# Patient Record
Sex: Male | Born: 1990 | Hispanic: No | Marital: Single | Smoking: Never smoker
Health system: Southern US, Community
[De-identification: ages and names within clinical notes are randomized; demographics above are authoritative.]

## PROBLEM LIST (undated history)

## (undated) DIAGNOSIS — D751 Secondary polycythemia: Secondary | ICD-10-CM

## (undated) DIAGNOSIS — F419 Anxiety disorder, unspecified: Secondary | ICD-10-CM

## (undated) DIAGNOSIS — J302 Other seasonal allergic rhinitis: Secondary | ICD-10-CM

## (undated) HISTORY — DX: Secondary polycythemia: D75.1

---
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
## 2008-09-03 ENCOUNTER — Emergency Department (HOSPITAL_COMMUNITY): Admission: EM | Admit: 2008-09-03 | Discharge: 2008-09-03 | Payer: Self-pay | Admitting: Emergency Medicine

## 2009-09-20 ENCOUNTER — Emergency Department (HOSPITAL_COMMUNITY): Admission: EM | Admit: 2009-09-20 | Discharge: 2009-09-20 | Payer: Self-pay | Admitting: Emergency Medicine

## 2011-10-17 ENCOUNTER — Encounter: Payer: Self-pay | Admitting: Emergency Medicine

## 2011-10-17 ENCOUNTER — Emergency Department (HOSPITAL_COMMUNITY)
Admission: EM | Admit: 2011-10-17 | Discharge: 2011-10-17 | Disposition: A | Discharge status: Home / Self Care | Payer: Self-pay | Attending: Emergency Medicine | Admitting: Emergency Medicine

## 2011-10-17 DIAGNOSIS — T7840XA Allergy, unspecified, initial encounter: Secondary | ICD-10-CM

## 2011-10-17 DIAGNOSIS — L298 Other pruritus: Secondary | ICD-10-CM | POA: Insufficient documentation

## 2011-10-17 DIAGNOSIS — T781XXA Other adverse food reactions, not elsewhere classified, initial encounter: Secondary | ICD-10-CM | POA: Insufficient documentation

## 2011-10-17 DIAGNOSIS — R221 Localized swelling, mass and lump, neck: Secondary | ICD-10-CM | POA: Insufficient documentation

## 2011-10-17 DIAGNOSIS — L2989 Other pruritus: Secondary | ICD-10-CM | POA: Insufficient documentation

## 2011-10-17 DIAGNOSIS — R220 Localized swelling, mass and lump, head: Secondary | ICD-10-CM | POA: Insufficient documentation

## 2011-10-17 DIAGNOSIS — J302 Other seasonal allergic rhinitis: Secondary | ICD-10-CM | POA: Insufficient documentation

## 2011-10-17 HISTORY — DX: Other seasonal allergic rhinitis: J30.2

## 2011-10-17 MED ORDER — FAMOTIDINE 20 MG PO TABS
20.0000 mg | ORAL_TABLET | Freq: Once | ORAL | Status: AC
  Administered 2011-10-17: 20 mg via ORAL
  Filled 2011-10-17: qty 1

## 2011-10-17 MED ORDER — DIPHENHYDRAMINE HCL 25 MG PO CAPS
50.0000 mg | ORAL_CAPSULE | Freq: Once | ORAL | Status: AC
  Administered 2011-10-17: 50 mg via ORAL
  Filled 2011-10-17: qty 2

## 2011-10-17 MED ORDER — PREDNISONE 20 MG PO TABS
60.0000 mg | ORAL_TABLET | Freq: Once | ORAL | Status: AC
  Administered 2011-10-17: 60 mg via ORAL
  Filled 2011-10-17: qty 3

## 2011-10-17 MED ORDER — PREDNISONE 20 MG PO TABS: 40.0000 mg | ORAL_TABLET | Freq: Every day | ORAL | Status: AC

## 2011-10-17 NOTE — ED Notes (Signed)
Pt c/o allergic reaction with itching on lips x 3 days and swollen area in top of palate; pt sts started 3 days ago; pt denies SOB or trouble breathing

## 2011-10-17 NOTE — ED Notes (Signed)
No obvious swelling noted. Pt breathing easy unlabored. No distress noted.

## 2011-10-17 NOTE — ED Notes (Signed)
Pt states feels better. No resp distress noted.

## 2011-10-17 NOTE — ED Provider Notes (Signed)
History     CSN: [id number]  Arrival date & time 10/17/11  1555   First MD Initiated Contact with Patient 10/17/11 2019      Chief Complaint  Patient presents with  . Allergic Reaction    (Consider location/radiation/quality/duration/timing/severity/associated sxs/prior treatment) Patient is a 20 y.o. male presenting with allergic reaction. The history is provided by the patient.  Allergic Reaction The primary symptoms do not include wheezing, shortness of breath, cough, nausea or vomiting. The current episode started more than 2 days ago. The problem has not changed since onset. Significant symptoms also include itching.   patient reports that after eating an Asian restaurant on Sunday evening he began to notice itching and slight swelling to his lips, and a swelling sensation to his palate. The symptoms seemed to resolve and then return. Now he is most concerned with the swelling to a small area on his palate just behind his front teeth. Since the symptoms did not completely resolve he began to become concerned about infection. Denies that he has or has ever had shortness of breath wheezing or difficulty swallowing since onset of symptoms. He does state that the itching and slight swelling to his lips continues to be intermittent.  Past Medical History  Diagnosis Date  . Seasonal allergies     History reviewed. No pertinent past surgical history.  History reviewed. No pertinent family history.  History  Substance Use Topics  . Smoking status: Passive Smoker  . Smokeless tobacco: Not on file  . Alcohol Use: Yes      Review of Systems  Constitutional: Negative.   HENT: Negative.   Eyes: Negative.   Respiratory: Negative.  Negative for cough, shortness of breath and wheezing.   Cardiovascular: Negative.   Gastrointestinal: Negative.  Negative for nausea and vomiting.  Genitourinary: Negative.   Musculoskeletal: Negative.   Skin: Positive for itching.  Neurological:  Negative.   Hematological: Negative.   Psychiatric/Behavioral: Negative.     Allergies  Penicillins  Home Medications  No current outpatient prescriptions on file.  BP 125/86  Pulse 72  Temp(Src) 98.3 F (36.8 C) (Oral)  Resp 16  SpO2 100%  Physical Exam  Constitutional: He appears well-developed and well-nourished.  HENT:  Head: Normocephalic and atraumatic.  Mouth/Throat: Uvula is midline and mucous membranes are normal. No uvula swelling.         Area just behind pt's front teeth that he perceives as swollen and TTP. No objective swelling, erythema, or lesions noted.  Eyes: Conjunctivae are normal.  Neck: Neck supple.  Cardiovascular: Normal rate and regular rhythm.   Pulmonary/Chest: Effort normal and breath sounds normal.  Abdominal: Soft. Bowel sounds are normal.  Musculoskeletal: Normal range of motion.  Neurological: He is alert.  Skin: Skin is warm and dry.  Psychiatric: He has a normal mood and affect.    ED Course  Procedures findings and impression discussed with patient. Will treat his allergic reaction and encourage patient to get established with primary care physician. Referrals provided. Patient has also been instructed to return for worsening symptoms. Patient is agreeable with plan.  Labs Reviewed - No data to display No results found.   No diagnosis found.    MDM  HPI/PE and findings most c/w mild allergic reaction.        Leanne Chang, NP 10/17/11 2212

## 2011-10-21 NOTE — ED Provider Notes (Signed)
Medical screening examination/treatment/procedure(s) were performed by non-physician practitioner and as supervising physician I was immediately available for consultation/collaboration.  Alaiza Yau M Tamela Elsayed, MD 10/21/11 2136 

## 2012-04-15 ENCOUNTER — Emergency Department (HOSPITAL_COMMUNITY): Payer: Self-pay

## 2012-04-15 ENCOUNTER — Emergency Department (HOSPITAL_COMMUNITY)
Admission: EM | Admit: 2012-04-15 | Discharge: 2012-04-15 | Disposition: A | Discharge status: Home / Self Care | Payer: Self-pay | Attending: Emergency Medicine | Admitting: Emergency Medicine

## 2012-04-15 ENCOUNTER — Encounter (HOSPITAL_COMMUNITY): Payer: Self-pay | Admitting: *Deleted

## 2012-04-15 DIAGNOSIS — F411 Generalized anxiety disorder: Secondary | ICD-10-CM | POA: Insufficient documentation

## 2012-04-15 DIAGNOSIS — R002 Palpitations: Secondary | ICD-10-CM | POA: Insufficient documentation

## 2012-04-15 DIAGNOSIS — R071 Chest pain on breathing: Secondary | ICD-10-CM | POA: Insufficient documentation

## 2012-04-15 DIAGNOSIS — R0789 Other chest pain: Secondary | ICD-10-CM

## 2012-04-15 MED ORDER — IBUPROFEN 800 MG PO TABS: 800.0000 mg | ORAL_TABLET | Freq: Three times a day (TID) | ORAL | Status: AC

## 2012-04-15 MED ORDER — IBUPROFEN 800 MG PO TABS
800.0000 mg | ORAL_TABLET | Freq: Once | ORAL | Status: AC
  Administered 2012-04-15: 800 mg via ORAL
  Filled 2012-04-15: qty 1

## 2012-04-15 NOTE — ED Notes (Signed)
Prescription given with discharge instructions.  

## 2012-04-15 NOTE — ED Notes (Signed)
Pt is here with intermittent heart racing episodes over the last month that sometimes wakes him from sleep.  Pt states chest pain to left chest now.  SR 91 on monitor

## 2012-04-15 NOTE — ED Provider Notes (Signed)
History     CSN: [id number]  Arrival date & time 04/15/12  0544   First MD Initiated Contact with Patient 04/15/12 [phone]      Chief Complaint  Patient presents with  . Chest Pain  . heart racing     (Consider location/radiation/quality/duration/timing/severity/associated sxs/prior treatment) HPI History provided by patient. Left-sided chest pain for the last 3 days. Denies any trauma. No fevers or chills. No recent illness. Occasionally smokes tobacco. No rash. No history of same. Patient has associated anxiety and intermittent palpitations. He denies any recent lifting. He is currently unemployed. He has not taken any medications for this at home. Moderate severity. Sharp in quality. No radiation of pain. No cough or shortness of breath. No back pain. No history of same. No history of thyroid problems. Does drink caffeine. Denies drug use. No leg pain or swelling. No history of DVT or PE. Past Medical History  Diagnosis Date  . Seasonal allergies     No past surgical history on file.  No family history on file.  History  Substance Use Topics  . Smoking status: Passive Smoker  . Smokeless tobacco: Not on file  . Alcohol Use: Yes     occ      Review of Systems  Constitutional: Negative for fever and chills.  HENT: Negative for neck pain and neck stiffness.   Eyes: Negative for pain.  Respiratory: Negative for shortness of breath.   Cardiovascular: Positive for chest pain.  Gastrointestinal: Negative for abdominal pain.  Genitourinary: Negative for dysuria.  Musculoskeletal: Negative for back pain.  Skin: Negative for rash.  Neurological: Negative for headaches.  All other systems reviewed and are negative.    Allergies  Penicillins  Home Medications  No current outpatient prescriptions on file.  BP 127/88  Pulse 89  Temp 98.3 F (36.8 C) (Oral)  Resp 20  SpO2 100%  Physical Exam  Constitutional: He is oriented to person, place, and time. He appears  well-developed and well-nourished.  HENT:  Head: Normocephalic and atraumatic.  Eyes: Conjunctivae and EOM are normal. Pupils are equal, round, and reactive to light.  Neck: Trachea normal. Neck supple. No thyromegaly present.  Cardiovascular: Normal rate, regular rhythm, S1 normal, S2 normal and normal pulses.     No systolic murmur is present   No diastolic murmur is present  Pulses:      Radial pulses are 2+ on the right side, and 2+ on the left side.  Pulmonary/Chest: Effort normal and breath sounds normal. He has no wheezes. He has no rhonchi. He has no rales.       Reproducible left anterior chest wall tenderness. No crepitus. No rash or erythema.  Abdominal: Soft. Normal appearance and bowel sounds are normal. There is no tenderness. There is no CVA tenderness and negative Murphy's sign.  Musculoskeletal:       BLE:s Calves nontender, no cords or erythema, negative Homans sign  Neurological: He is alert and oriented to person, place, and time. He has normal strength. No cranial nerve deficit or sensory deficit. GCS eye subscore is 4. GCS verbal subscore is 5. GCS motor subscore is 6.  Skin: Skin is warm and dry. No rash noted. He is not diaphoretic.  Psychiatric: His speech is normal.       Cooperative and appropriate    ED Course  Procedures (including critical care time)   Date: 04/15/2012  Rate: 93  Rhythm: normal sinus rhythm  QRS Axis: normal  Intervals: normal  ST/T Wave abnormalities: nonspecific ST changes  Conduction Disutrbances:none  Narrative Interpretation:   Old EKG Reviewed: none available  PO motrin.  Chest x-ray obtained and reviewed.  No results found for this or any previous visit. Dg Chest Portable 1 View  04/15/2012  *RADIOLOGY REPORT*  Clinical Data: Left chest pain  PORTABLE CHEST - 1 VIEW  Comparison: None.  Findings: Lungs are clear. No pleural effusion or pneumothorax.  Pulmonary vascular congestion/central hilar prominence.  Cardiomediastinal  silhouette is otherwise within normal limits.  IMPRESSION: No evidence of acute cardiopulmonary disease.  Original Report Authenticated By: Charline Bills, M.D.       MDM   Left-sided reproducible chest pain consistent with chest wall pain likely musculoskeletal in nature. No risk factors for ACS. Plan discharge home, NSAIDs and outpatient followup.         Sunnie Nielsen, MD 04/15/12 0730

## 2012-04-15 NOTE — Discharge Instructions (Signed)
Chest Wall Pain Chest wall pain is pain in or around the bones and muscles of your chest. It may take up to 6 weeks to get better. It may take longer if you must stay physically active in your work and activities.  CAUSES  Chest wall pain may happen on its own. However, it may be caused by:  A viral illness like the flu.   Injury.   Coughing.   Exercise.   Arthritis.   Fibromyalgia.   Shingles.  HOME CARE INSTRUCTIONS   Avoid overtiring physical activity. Try not to strain or perform activities that cause pain. This includes any activities using your chest or your abdominal and side muscles, especially if heavy weights are used.   Put ice on the sore area.   Put ice in a plastic bag.   Place a towel between your skin and the bag.   Leave the ice on for 15 to 20 minutes per hour while awake for the first 2 days.   Only take over-the-counter or prescription medicines for pain, discomfort, or fever as directed by your caregiver.  SEEK IMMEDIATE MEDICAL CARE IF:   Your pain increases, or you are very uncomfortable.   You have a fever.   Your chest pain becomes worse.   You have new, unexplained symptoms.   You have nausea or vomiting.   You feel sweaty or lightheaded.   You have a cough with phlegm (sputum), or you cough up blood.  MAKE SURE YOU:   Understand these instructions.   Will watch your condition.   Will get help right away if you are not doing well or get worse.  Document Released: 10/08/2005 Document Revised: 09/27/2011 Document Reviewed: 06/04/2011 [employer] Patient Information 2012 [employer]. 

## 2013-01-16 ENCOUNTER — Other Ambulatory Visit: Payer: Self-pay

## 2013-01-16 ENCOUNTER — Emergency Department (HOSPITAL_COMMUNITY)
Admission: EM | Admit: 2013-01-16 | Discharge: 2013-01-16 | Disposition: A | Discharge status: Home / Self Care | Payer: Self-pay | Attending: Emergency Medicine | Admitting: Emergency Medicine

## 2013-01-16 ENCOUNTER — Encounter (HOSPITAL_COMMUNITY): Payer: Self-pay | Admitting: *Deleted

## 2013-01-16 ENCOUNTER — Emergency Department (HOSPITAL_COMMUNITY): Payer: Self-pay

## 2013-01-16 DIAGNOSIS — R002 Palpitations: Secondary | ICD-10-CM | POA: Insufficient documentation

## 2013-01-16 DIAGNOSIS — R42 Dizziness and giddiness: Secondary | ICD-10-CM | POA: Insufficient documentation

## 2013-01-16 DIAGNOSIS — R079 Chest pain, unspecified: Secondary | ICD-10-CM | POA: Insufficient documentation

## 2013-01-16 DIAGNOSIS — R0789 Other chest pain: Secondary | ICD-10-CM

## 2013-01-16 LAB — CBC
HCT: 43.7 % (ref 39.0–52.0)
Hemoglobin: 14.8 g/dL (ref 13.0–17.0)
MCH: 29.6 pg (ref 26.0–34.0)
MCHC: 33.9 g/dL (ref 30.0–36.0)
MCV: 87.4 fL (ref 78.0–100.0)
RBC: 5 MIL/uL (ref 4.22–5.81)

## 2013-01-16 LAB — BASIC METABOLIC PANEL
BUN: 16 mg/dL (ref 6–23)
CO2: 28 mEq/L (ref 19–32)
Calcium: 9.4 mg/dL (ref 8.4–10.5)
Creatinine, Ser: 1.2 mg/dL (ref 0.50–1.35)
GFR calc non Af Amer: 84 mL/min — ABNORMAL LOW (ref >90)
Glucose, Bld: 98 mg/dL (ref 70–99)
Sodium: 137 mEq/L (ref 135–145)

## 2013-01-16 NOTE — ED Notes (Signed)
Pt c/o CP when he woke from sleep this morning, associated with dizziness, nausea, SOB, and pt states he "feels his heart racing".

## 2013-01-16 NOTE — ED Provider Notes (Signed)
History     CSN: [id number]  Arrival date & time 01/16/13  4098   First MD Initiated Contact with Patient 01/16/13 0703      Chief Complaint  Patient presents with  . Chest Pain    (Consider location/radiation/quality/duration/timing/severity/associated sxs/prior treatment) HPI Comments: 22 y.o. Males presents with sudden onset left-sided chest pain that woke him from sleep about an hour ago. States he felt a stabbing pain in his left chest, it did not radiate, not pleuritic, nothing made it better or worse. Rated it 10/10 and constant. Similar to pain he had when he was seen here last year, but more severe. Pt splashed cold water on his face and took a drink of water to try to make it feel better with no relief.   Pt states pain decreased as he drove here and decreased to 5/10 as he is here in the ED.  Admits palpitations, dizziness Denies recent fever/illness, dyspnea, nausea/vomiting, headache, visual disturbances, diaphoresis.  Pt states he is anxious over some final exams he is taking for school and has been taking Benadryl a few times a week for what he perceives as allergies though he has not been seen by a doctor in some time.  Dad is at bedside.   Patient is a 22 y.o. male presenting with chest pain.  Chest Pain Associated symptoms: dizziness and palpitations   Associated symptoms: no cough, no diaphoresis, no fever, no headache, no nausea, no numbness, no shortness of breath, not vomiting and no weakness     Past Medical History  Diagnosis Date  . Seasonal allergies     History reviewed. No pertinent past surgical history.  History reviewed. No pertinent family history.  History  Substance Use Topics  . Smoking status: Passive Smoke Exposure - Never Smoker  . Smokeless tobacco: Not on file  . Alcohol Use: Yes     Comment: occ      Review of Systems  Constitutional: Negative for fever and diaphoresis.  HENT: Negative for neck pain and neck stiffness.    Eyes: Negative for visual disturbance.  Respiratory: Negative for apnea, cough, chest tightness and shortness of breath.   Cardiovascular: Positive for chest pain and palpitations.  Gastrointestinal: Negative for nausea, vomiting, diarrhea and constipation.  Musculoskeletal: Negative for gait problem.  Skin: Negative for color change.  Neurological: Positive for dizziness. Negative for weakness, light-headedness, numbness and headaches.    Allergies  Penicillins  Home Medications  No current outpatient prescriptions on file.  BP 147/87  Pulse 88  Temp(Src) 97.2 F (36.2 C) (Oral)  Resp 20  SpO2 100%  Physical Exam  Nursing note and vitals reviewed. Constitutional: He is oriented to person, place, and time. He appears well-developed and well-nourished. No distress.  HENT:  Head: Normocephalic and atraumatic.  Eyes: Conjunctivae and EOM are normal.  Neck: Normal range of motion. Neck supple.  No meningeal signs  Cardiovascular: Normal rate, regular rhythm and normal heart sounds.  Exam reveals no gallop and no friction rub.   No murmur heard. Pulmonary/Chest: Effort normal and breath sounds normal. No respiratory distress. He has no wheezes. He has no rales. He exhibits tenderness.  Left sided chest pain reproducible on palpation.  Abdominal: Soft. There is no tenderness.  Musculoskeletal: Normal range of motion. He exhibits no edema and no tenderness.  5/5 strength throughout  Neurological: He is alert and oriented to person, place, and time. No cranial nerve deficit.  No focal deficits  Skin: Skin is warm  and dry. He is not diaphoretic. No erythema.  Psychiatric: He has a normal mood and affect.    ED Course  Procedures (including critical care time)  Labs Reviewed  BASIC METABOLIC PANEL - Abnormal; Notable for the following:    Potassium 3.4 (*)    GFR calc non Af Amer 84 (*)    All other components within normal limits  CBC  POCT I-STAT TROPONIN I   Dg Chest  2 View  01/16/2013  *RADIOLOGY REPORT*  Clinical Data: Chest pain.  CHEST - 2 VIEW  Comparison: Single view of the chest 04/15/2012.  Findings: Lungs are clear.  Heart size is normal.  No pneumothorax or pleural effusion.  IMPRESSION: Negative chest.   Original Report Authenticated By: Holley Dexter, M.D.     Date: 01/16/2013  Rate: 86  Rhythm: normal sinus rhythm  QRS Axis: normal  Intervals: normal  ST/T Wave abnormalities: normal  Conduction Disutrbances: none  Narrative Interpretation: Normal EKG  Old EKG Reviewed: 04/15/2012, normal sinus rhythm    1. Palpitations   2. Chest pain, non-cardiac       MDM  Low suspicion for ACS due to age and lack of risk factors. Orders placed as per protocol and returned unconcerning. Chest pain is not likely of cardiac or pulmonary etiology d/t presentation, perc negative, VSS, no tracheal deviation, no JVD or new murmur, RRR, breath sounds equal bilaterally, EKG without acute abnormalities, negative troponin, and negative CXR.   Return precautions discussed if CP becomes exertional, associated with diaphoresis or nausea, radiates to left jaw/arm, worsens or becomes concerning in any way. Patient is to be discharged with recommendation to establish relationship with PCP (resource list given).  Pt appears reliable for follow up and is agreeable to discharge.   Case has been discussed with and seen by Dr. Bernette Mayers who agrees with the above plan to discharge.    Glade Nurse, PA-C 01/16/13 0819  Glade Nurse, PA-C 01/16/13 [phone]

## 2013-01-16 NOTE — ED Provider Notes (Signed)
Medical screening examination/treatment/procedure(s) were performed by non-physician practitioner and as supervising physician I was immediately available for consultation/collaboration.   Charles B. Bernette Mayers, MD 01/16/13 [phone]

## 2013-03-15 ENCOUNTER — Encounter (HOSPITAL_COMMUNITY): Payer: Self-pay | Admitting: *Deleted

## 2013-03-15 ENCOUNTER — Emergency Department (HOSPITAL_COMMUNITY)
Admission: EM | Admit: 2013-03-15 | Discharge: 2013-03-15 | Disposition: A | Discharge status: Home / Self Care | Payer: No Typology Code available for payment source | Attending: Emergency Medicine | Admitting: Emergency Medicine

## 2013-03-15 ENCOUNTER — Emergency Department (HOSPITAL_COMMUNITY): Payer: No Typology Code available for payment source

## 2013-03-15 DIAGNOSIS — Z880 Allergy status to penicillin: Secondary | ICD-10-CM | POA: Insufficient documentation

## 2013-03-15 DIAGNOSIS — R002 Palpitations: Secondary | ICD-10-CM | POA: Insufficient documentation

## 2013-03-15 LAB — POCT I-STAT TROPONIN I: Troponin i, poc: 0 ng/mL (ref 0.00–0.08)

## 2013-03-15 LAB — BASIC METABOLIC PANEL
BUN: 14 mg/dL (ref 6–23)
Creatinine, Ser: 1.26 mg/dL (ref 0.50–1.35)
GFR calc Af Amer: >90 mL/min (ref >90)
GFR calc non Af Amer: 79 mL/min — ABNORMAL LOW (ref >90)
Glucose, Bld: 108 mg/dL — ABNORMAL HIGH (ref 70–99)
Potassium: 3.7 mEq/L (ref 3.5–5.1)

## 2013-03-15 LAB — CBC
HCT: 46 % (ref 39.0–52.0)
Hemoglobin: 15.9 g/dL (ref 13.0–17.0)
MCH: 29.8 pg (ref 26.0–34.0)
MCHC: 34.6 g/dL (ref 30.0–36.0)
MCV: 86.3 fL (ref 78.0–100.0)
RDW: 12.2 % (ref 11.5–15.5)

## 2013-03-15 NOTE — ED Notes (Signed)
Pt c/o left sided cp this am and "heart palpitations." Pt states he felt heart was beating too fast this am. Pt states now it feels better but it having mild left sided cp, inc with palpation. NAD noted. NSR on monitor, no ectopy.

## 2013-03-15 NOTE — ED Provider Notes (Signed)
History     CSN: [id number]  Arrival date & time 03/15/13  1001   First MD Initiated Contact with Patient 03/15/13 1114      Chief Complaint  Patient presents with  . Chest Pain    (Consider location/radiation/quality/duration/timing/severity/associated sxs/prior treatment) HPI Comments: Patient visits to the ED for palpitations. Reports he woke up this a.m. approximately 10 AM with a sensation of irregular heartbeat and pounding sensations in his chest.  Palpitations associated with mild left-sided chest pressure. Denies any associated shortness of breath, dizziness, or weakness.  Patient does not this episode made him very nervous and he is concerned for underlying pathology. Was seen earlier this year for similar symptoms without diagnosis.  Was referred to cardiology at that time but has not followed up.  Denies any increased stress recently. No history of anxiety, asthma, or GERD. Patient has no cardiac RF.   The history is provided by the patient.    Past Medical History  Diagnosis Date  . Seasonal allergies     History reviewed. No pertinent past surgical history.  History reviewed. No pertinent family history.  History  Substance Use Topics  . Smoking status: Passive Smoke Exposure - Never Smoker  . Smokeless tobacco: Not on file  . Alcohol Use: Yes     Comment: occ      Review of Systems  Cardiovascular: Positive for palpitations.  All other systems reviewed and are negative.    Allergies  Penicillins  Home Medications   Current Outpatient Rx  Name  Route  Sig  Dispense  Refill  . diphenhydrAMINE (BENADRYL) 25 MG tablet   Oral   Take 25 mg by mouth every 6 (six) hours as needed for itching.           BP 137/70  Pulse 81  Temp(Src) 98.3 F (36.8 C) (Oral)  Resp 15  SpO2 100%  Physical Exam  Nursing note and vitals reviewed. Constitutional: He is oriented to person, place, and time. He appears well-developed and well-nourished. No distress.   HENT:  Head: Normocephalic and atraumatic.  Eyes: Conjunctivae and EOM are normal.  Neck: Normal range of motion. Neck supple.  Cardiovascular: Normal rate, regular rhythm and normal heart sounds.   No murmur heard. Pulmonary/Chest: Effort normal and breath sounds normal. No respiratory distress. He has no rales.  Abdominal: Soft. Bowel sounds are normal. There is no tenderness. There is no guarding.  Musculoskeletal: Normal range of motion. He exhibits no edema.  Neurological: He is alert and oriented to person, place, and time.  Skin: Skin is warm and dry. He is not diaphoretic.  Psychiatric: He has a normal mood and affect.    ED Course  Procedures (including critical care time)   Date: 03/15/2013  Rate: 103  Rhythm: sinus tachycardia  QRS Axis: normal  Intervals: normal  ST/T Wave abnormalities: normal  Conduction Disutrbances:none  Narrative Interpretation: sinus tach, no STEMI  Old EKG Reviewed: unchanged    Labs Reviewed  BASIC METABOLIC PANEL - Abnormal; Notable for the following:    Glucose, Bld 108 (*)    GFR calc non Af Amer 79 (*)    All other components within normal limits  CBC  POCT I-STAT TROPONIN I   Dg Chest 2 View  03/15/2013   *RADIOLOGY REPORT*  Clinical Data: Chest pain, shortness of breath  CHEST - 2 VIEW  Comparison: 01/16/2013  Findings: Lungs are clear. No pleural effusion or pneumothorax.  Cardiomediastinal silhouette is within normal limits.  Visualized osseous structures are within normal limits.  IMPRESSION: Normal chest radiographs.   Original Report Authenticated By: Charline Bills, M.D.     1. Palpitations       MDM   22 year old male presenting to the ED for palpitations. Similar episodes previously without diagnosis. Has been referred to cardiology but has not followed up.  EKG at triage with sinus tach.  HR since regulated, now currently 80-85 when talking with pt.  No further episodes of palpitations in the ED.  CXR clear.   Trop negative.  Pt has no cardiac RF- low suspicion for ACS.  FU with cardiology as instructed at last visit- likely holter monitor to eval palpitations.  Discussed plan with pt, he agreed.  Return precautions advised.         Garlon Hatchet, PA-C 03/15/13 1245

## 2013-03-15 NOTE — Discharge Instructions (Signed)
See attached documents for more information about your diagnosis. Follow-up with [facility] cardiology- i have provided contact information for you. Return to the ED for new or worsening symptoms.

## 2013-03-15 NOTE — ED Provider Notes (Signed)
Medical screening examination/treatment/procedure(s) were performed by non-physician practitioner and as supervising physician I was immediately available for consultation/collaboration.    Nelia Shi, MD 03/15/13 [phone]

## 2013-03-15 NOTE — ED Notes (Signed)
Pt reports waking up this am and having irregular HR and palpitations. Mild left side chest pains. Denies sob. Reports being here for same in past but no diagnosis. ekg done at triage, HR 102.

## 2013-04-20 ENCOUNTER — Emergency Department (INDEPENDENT_AMBULATORY_CARE_PROVIDER_SITE_OTHER)
Admission: EM | Admit: 2013-04-20 | Discharge: 2013-04-20 | Disposition: A | Discharge status: Home / Self Care | Payer: No Typology Code available for payment source | Source: Home / Self Care | Attending: Emergency Medicine | Admitting: Emergency Medicine

## 2013-04-20 ENCOUNTER — Encounter (HOSPITAL_COMMUNITY): Payer: Self-pay

## 2013-04-20 DIAGNOSIS — L259 Unspecified contact dermatitis, unspecified cause: Secondary | ICD-10-CM

## 2013-04-20 DIAGNOSIS — L309 Dermatitis, unspecified: Secondary | ICD-10-CM

## 2013-04-20 MED ORDER — TRIAMCINOLONE ACETONIDE 0.1 % EX CREA: TOPICAL_CREAM | Freq: Three times a day (TID) | CUTANEOUS | Status: DC

## 2013-04-20 NOTE — ED Notes (Signed)
Concern for poss RMSF, as he went on same trip to [employer] park w friend who later developed syx. No known tick bite, but thinks he has a rash on his hip and wants to be checked

## 2013-04-20 NOTE — ED Provider Notes (Signed)
Chief Complaint:   Chief Complaint  Patient presents with  . Rash    History of Present Illness:   Gary Chavez is a 22 year old male presents today with a mild skin rash on his left buttock and thigh. He is concerned about the possibility of [facility] Spotted Fever. Over the weekend, 4 days ago, he was with a friend, hiking at [employer]. His friend was recently diagnosed with [facility] spotted fever. The patient himself has not had a tick bite. The rash appeared 2 days ago. It's mildly pruritic. He denies any fever, chills, sweats, headache, photophobia, stiff neck, myalgias, joint pain, or any other rash.  Review of Systems:  Other than noted above, the patient denies any of the following symptoms: Systemic:  No fever, chills, sweats, weight loss, or fatigue. ENT:  No nasal congestion, rhinorrhea, sore throat, swelling of lips, tongue or throat. Resp:  No cough, wheezing, or shortness of breath. Skin:  No rash, itching, nodules, or suspicious lesions.  PMFSH:  Past medical history, family history, social history, meds, and allergies were reviewed.   Physical Exam:   Vital signs:  BP 150/99  Pulse 81  Temp(Src) 98.3 F (36.8 C) (Oral)  Resp 16  SpO2 98% Gen:  Alert, oriented, in no distress. ENT:  Pharynx clear, no intraoral lesions, moist mucous membranes. Lungs:  Clear to auscultation. Skin:  He has a very few mildly erythematous maculopapules on his left buttock. His skin is otherwise completely clear including no petechial rash and no lesions on his ankles or wrists.  Assessment:  The encounter diagnosis was Dermatitis.  Probably allergic contact dermatitis versus mild folliculitis. No evidence for spotted fever right now.  Plan:   1.  The following meds were prescribed:   Discharge Medication List as of 04/20/2013  8:07 PM    START taking these medications   Details  triamcinolone cream (KENALOG) 0.1 % Apply topically 3 (three) times daily.,  Starting 04/20/2013, Until Discontinued, Normal       2.  The patient was instructed in symptomatic care and handouts were given. He was urged to be on the lookout for any symptoms of spotted fever including fever, chills, headache, joint pain, or petechial rash on his arms or legs. 3.  The patient was told to return if becoming worse in any way, if no better in 3 or 4 days, and given some red flag symptoms such as any of the above symptoms that would indicate earlier return. 4.  Follow up here if necessary.     Reuben Likes, MD 04/20/13 2220

## 2013-04-30 ENCOUNTER — Ambulatory Visit (INDEPENDENT_AMBULATORY_CARE_PROVIDER_SITE_OTHER): Payer: No Typology Code available for payment source | Admitting: Internal Medicine

## 2013-04-30 ENCOUNTER — Other Ambulatory Visit (INDEPENDENT_AMBULATORY_CARE_PROVIDER_SITE_OTHER): Payer: No Typology Code available for payment source

## 2013-04-30 ENCOUNTER — Encounter: Payer: Self-pay | Admitting: Internal Medicine

## 2013-04-30 VITALS — BP 134/86 | HR 86 | Temp 97.9°F | Ht 75.0 in | Wt 295.0 lb

## 2013-04-30 DIAGNOSIS — Z1329 Encounter for screening for other suspected endocrine disorder: Secondary | ICD-10-CM

## 2013-04-30 DIAGNOSIS — Z0000 Encounter for general adult medical examination without abnormal findings: Secondary | ICD-10-CM

## 2013-04-30 DIAGNOSIS — Z13220 Encounter for screening for lipoid disorders: Secondary | ICD-10-CM

## 2013-04-30 DIAGNOSIS — Z130 Encounter for screening for diseases of the blood and blood-forming organs and certain disorders involving the immune mechanism: Secondary | ICD-10-CM

## 2013-04-30 DIAGNOSIS — E669 Obesity, unspecified: Secondary | ICD-10-CM

## 2013-04-30 DIAGNOSIS — R002 Palpitations: Secondary | ICD-10-CM

## 2013-04-30 DIAGNOSIS — K219 Gastro-esophageal reflux disease without esophagitis: Secondary | ICD-10-CM

## 2013-04-30 DIAGNOSIS — Z131 Encounter for screening for diabetes mellitus: Secondary | ICD-10-CM

## 2013-04-30 DIAGNOSIS — Z23 Encounter for immunization: Secondary | ICD-10-CM

## 2013-04-30 LAB — BASIC METABOLIC PANEL
BUN: 14 mg/dL (ref 6–23)
CO2: 33 mEq/L — ABNORMAL HIGH (ref 19–32)
GFR: 75.54 mL/min (ref >60.00)
Glucose, Bld: 87 mg/dL (ref 70–99)
Potassium: 4 mEq/L (ref 3.5–5.1)
Sodium: 133 mEq/L — ABNORMAL LOW (ref 135–145)

## 2013-04-30 LAB — LIPID PANEL
HDL: 38.6 mg/dL — ABNORMAL LOW (ref >39.00)
Total CHOL/HDL Ratio: 6
VLDL: 39.6 mg/dL (ref 0.0–40.0)

## 2013-04-30 LAB — CBC
MCV: 88.3 fl (ref 78.0–100.0)
Platelets: 228 10*3/uL (ref 150.0–400.0)
RBC: 5.35 Mil/uL (ref 4.22–5.81)
WBC: 10.3 10*3/uL (ref 4.5–10.5)

## 2013-04-30 LAB — LDL CHOLESTEROL, DIRECT: Direct LDL: 181.3 mg/dL

## 2013-04-30 LAB — TSH: TSH: 1.59 u[IU]/mL (ref 0.35–5.50)

## 2013-04-30 MED ORDER — OMEPRAZOLE 20 MG PO CPDR: 20.0000 mg | DELAYED_RELEASE_CAPSULE | Freq: Every day | ORAL | Status: DC

## 2013-04-30 NOTE — Assessment & Plan Note (Signed)
Work on diet and exercise 

## 2013-04-30 NOTE — Patient Instructions (Addendum)
Health Maintenance, Males A healthy lifestyle and preventative care can promote health and wellness.  Maintain regular health, dental, and eye exams.  Eat a healthy diet. Foods like vegetables, fruits, whole grains, low-fat dairy products, and lean protein foods contain the nutrients you need without too many calories. Decrease your intake of foods high in solid fats, added sugars, and salt. Get information about a proper diet from your caregiver, if necessary.  Regular physical exercise is one of the most important things you can do for your health. Most adults should get at least 150 minutes of moderate-intensity exercise (any activity that increases your heart rate and causes you to sweat) each week. In addition, most adults need muscle-strengthening exercises on 2 or more days a week.   Maintain a healthy weight. The body mass index (BMI) is a screening tool to identify possible weight problems. It provides an estimate of body fat based on height and weight. Your caregiver can help determine your BMI, and can help you achieve or maintain a healthy weight. For adults 20 years and older:  A BMI below 18.5 is considered underweight.  A BMI of 18.5 to 24.9 is normal.  A BMI of 25 to 29.9 is considered overweight.  A BMI of 30 and above is considered obese.  Maintain normal blood lipids and cholesterol by exercising and minimizing your intake of saturated fat. Eat a balanced diet with plenty of fruits and vegetables. Blood tests for lipids and cholesterol should begin at age 20 and be repeated every 5 years. If your lipid or cholesterol levels are high, you are over 50, or you are a high risk for heart disease, you may need your cholesterol levels checked more frequently.Ongoing high lipid and cholesterol levels should be treated with medicines, if diet and exercise are not effective.  If you smoke, find out from your caregiver how to quit. If you do not use tobacco, do not start.  If you  choose to drink alcohol, do not exceed 2 drinks per day. One drink is considered to be 12 ounces (355 mL) of beer, 5 ounces (148 mL) of wine, or 1.5 ounces (44 mL) of liquor.  Avoid use of street drugs. Do not share needles with anyone. Ask for help if you need support or instructions about stopping the use of drugs.  High blood pressure causes heart disease and increases the risk of stroke. Blood pressure should be checked at least every 1 to 2 years. Ongoing high blood pressure should be treated with medicines if weight loss and exercise are not effective.  If you are 45 to 24 years old, ask your caregiver if you should take aspirin to prevent heart disease.  Diabetes screening involves taking a blood sample to check your fasting blood sugar level. This should be done once every 3 years, after age 45, if you are within normal weight and without risk factors for diabetes. Testing should be considered at a younger age or be carried out more frequently if you are overweight and have at least 1 risk factor for diabetes.  Colorectal cancer can be detected and often prevented. Most routine colorectal cancer screening begins at the age of 50 and continues through age 75. However, your caregiver may recommend screening at an earlier age if you have risk factors for colon cancer. On a yearly basis, your caregiver may provide home test kits to check for hidden blood in the stool. Use of a small camera at the end of a tube,   to directly examine the colon (sigmoidoscopy or colonoscopy), can detect the earliest forms of colorectal cancer. Talk to your caregiver about this at age 50, when routine screening begins. Direct examination of the colon should be repeated every 5 to 10 years through age 75, unless early forms of pre-cancerous polyps or small growths are found.  Hepatitis C blood testing is recommended for all people born from 1945 through 1965 and any individual with known risks for hepatitis C.  Healthy  men should no longer receive prostate-specific antigen (PSA) blood tests as part of routine cancer screening. Consult with your caregiver about prostate cancer screening.  Testicular cancer screening is not recommended for adolescents or adult males who have no symptoms. Screening includes self-exam, caregiver exam, and other screening tests. Consult with your caregiver about any symptoms you have or any concerns you have about testicular cancer.  Practice safe sex. Use condoms and avoid high-risk sexual practices to reduce the spread of sexually transmitted infections (STIs).  Use sunscreen with a sun protection factor (SPF) of 30 or greater. Apply sunscreen liberally and repeatedly throughout the day. You should seek shade when your shadow is shorter than you. Protect yourself by wearing long sleeves, pants, a wide-brimmed hat, and sunglasses year round, whenever you are outdoors.  Notify your caregiver of new moles or changes in moles, especially if there is a change in shape or color. Also notify your caregiver if a mole is larger than the size of a pencil eraser.  A one-time screening for abdominal aortic aneurysm (AAA) and surgical repair of large AAAs by sound wave imaging (ultrasonography) is recommended for ages 65 to 75 years who are current or former smokers.  Stay current with your immunizations. Document Released: 04/05/2008 Document Revised: 12/31/2011 Document Reviewed: 03/05/2011 [employer] Patient Information 2014 [employer]. Self-Exam Of The Testicles Young men ages 15-35 are the ones who most commonly get cancer of the testicles. About 300 young men die of this disease each year. Testicular cancer does occur in middle-aged or older men but to a lesser extent. This cancer can almost always be cured if it is found before it gets bad and if it is treated. WORDS TO KNOW:  Testicles are the 2 egg-shaped glands that make hormones and sperm in men.  The scrotum is the skin around  testicles.  Epididymis is the rope-like part that is behind and above each testis. It collects sperm made by the testis. WHICH MEN ARE AT HIGH RISK FOR CANCER OF THE TESTICLES?  Men between ages 15 to 31.  Men who are Caucasian.  Men who were born with a testicle that had not moved down into the scrotum.  Men whose testicles have gotten smaller because of an infection.  Men whose fathers or brothers have had cancer of the testicles. SYMPTOMS OF TESTICULAR CANCER  A painless swelling in one of your testicles.  A hard lump. Some lumps may be an infection.  A heavy feeling in your testicles.  An ache in your lower belly (abdomen) or groin. HOW OFTEN SHOULD I CHECK MY TESTICLES? You should check your testicles every month. HOW SHOULD I DO THIS CHECK? It is best to check your testicles right after a warm shower or bath.  Look at your testicles for any swelling. You may need to use a mirror.  Use both your hands to roll each testicle between your thumb and fingers. Feel for any lumps or changes in the size of the testicle. Press firmly. You   may find that one testicle is a little bigger than the other. This is normal.  Next, check the epididymis on each testicle. This is the part where most testicular cancers happen. It is normal for the epididymis to feel soft and uneven. When you get used to how your epididymis feels, you will be able to tell if there is any change.  WHAT IF I FIND ANY SWELLINGS OR LUMPS?  Call your doctor. Some lumps may be an infection. If the lump or swelling is a cancer, it can be treated before it gets worse.  Document Released: 01/04/2009 Document Revised: 12/31/2011 Document Reviewed: 01/04/2009 Exit[employer] Patient Information 2014 [employer].  

## 2013-04-30 NOTE — Progress Notes (Signed)
HPI  Pt presents to the clinic today to establish care. He does not have a PCP. He would like to f/u regarding his palpitations. This started 1 month ago. The palpitations occur mostly at night. He feels a fluttering and a sharp pain in his chest. He describes it as a burning sensation. He does not eat prior to going to bed. He has not taken anything OTC for this.  He has no prior heart history. He has no family history of heart conditions. He denies chest tightness, dizziness, or shortness of breath with these episodes. He has been evaluated twice in the ER. Workup has been negative.  Flu: never Tdap: 2004  Past Medical History  Diagnosis Date  . Seasonal allergies     Current Outpatient Prescriptions  Medication Sig Dispense Refill  . diphenhydrAMINE (BENADRYL) 25 MG tablet Take 25 mg by mouth every 6 (six) hours as needed for itching or allergies.       Marland Kitchen triamcinolone cream (KENALOG) 0.1 % Apply topically 3 (three) times daily.  60 g  0   No current facility-administered medications for this visit.    Allergies  Allergen Reactions  . Penicillins Rash    Family History  Problem Relation Age of Onset  . Hypertension Mother   . Cancer Paternal Grandfather   . Diabetes Neg Hx   . Stroke Neg Hx     History   Social History  . Marital Status: Single    Spouse Name: N/A    Number of Children: N/A  . Years of Education: 12+   Occupational History  . Student    Social History Main Topics  . Smoking status: Former Games developer  . Smokeless tobacco: Never Used  . Alcohol Use: 1.2 oz/week    1 Cans of beer, 1 Glasses of wine per week     Comment: occ  . Drug Use: No  . Sexually Active: Not Currently   Other Topics Concern  . Not on file   Social History Narrative   Regular exercise-yes   Caffeine Use-yes    ROS:  Constitutional: Denies fever, malaise, fatigue, headache or abrupt weight changes.  HEENT: Denies eye pain, eye redness, ear pain, ringing in the ears, wax  buildup, runny nose, nasal congestion, bloody nose, or sore throat. Respiratory: Denies difficulty breathing, shortness of breath, cough or sputum production.   Cardiovascular: Pt reports palpitations. Denies chest pain, chest tightness, or swelling in the hands or feet.  Gastrointestinal: Denies abdominal pain, bloating, constipation, diarrhea or blood in the stool.  GU: Denies frequency, urgency, pain with urination, blood in urine, odor or discharge. Musculoskeletal: Denies decrease in range of motion, difficulty with gait, muscle pain or joint pain and swelling.  Skin: Denies redness, rashes, lesions or ulcercations.  Neurological: Denies dizziness, difficulty with memory, difficulty with speech or problems with balance and coordination.   No other specific complaints in a complete review of systems (except as listed in HPI above).  PE:  BP 134/86  Pulse 86  Temp(Src) 97.9 F (36.6 C) (Oral)  Ht 6\' 3"  (1.905 m)  Wt 295 lb (133.811 kg)  BMI 36.87 kg/m2  SpO2 95% Wt Readings from Last 3 Encounters:  04/30/13 295 lb (133.811 kg)  04/15/12 270 lb (122.471 kg)    General: Appears his stated age, obese but well developed, well nourished in NAD. HEENT: Head: normal shape and size; Eyes: sclera white, no icterus, conjunctiva pink, PERRLA and EOMs intact; Ears: Tm's gray and intact, normal  light reflex; Nose: mucosa pink and moist, septum midline; Throat/Mouth: Teeth present, mucosa pink and moist, no lesions or ulcerations noted.  Neck: Normal range of motion. Neck supple, trachea midline. No massses, lumps or thyromegaly present.  Cardiovascular: Normal rate and rhythm. S1,S2 noted.  No murmur, rubs or gallops noted. No JVD or BLE edema. No carotid bruits noted. Pulmonary/Chest: Normal effort and positive vesicular breath sounds. No respiratory distress. No wheezes, rales or ronchi noted.  Abdomen: Soft and nontender. Normal bowel sounds, no bruits noted. No distention or masses noted.  Liver, spleen and kidneys non palpable. Musculoskeletal: Normal range of motion. No signs of joint swelling. No difficulty with gait.  Neurological: Alert and oriented. Cranial nerves II-XII intact. Coordination normal. +DTRs bilaterally. Psychiatric: Mood and affect normal. Behavior is normal. Judgment and thought content normal.   EKG: reviewed, sinus tach  BMET    Component Value Date/Time   NA 137 03/15/2013 1011   K 3.7 03/15/2013 1011   CL 101 03/15/2013 1011   CO2 21 03/15/2013 1011   GLUCOSE 108* 03/15/2013 1011   BUN 14 03/15/2013 1011   CREATININE 1.26 03/15/2013 1011   CALCIUM 9.3 03/15/2013 1011   GFRNONAA 79* 03/15/2013 1011   GFRAA >90 03/15/2013 1011    Lipid Panel  No results found for this basename: chol, trig, hdl, cholhdl, vldl, ldlcalc    CBC    Component Value Date/Time   WBC 10.2 03/15/2013 1011   RBC 5.33 03/15/2013 1011   HGB 15.9 03/15/2013 1011   HCT 46.0 03/15/2013 1011   PLT 230 03/15/2013 1011   MCV 86.3 03/15/2013 1011   MCH 29.8 03/15/2013 1011   MCHC 34.6 03/15/2013 1011   RDW 12.2 03/15/2013 1011    Hgb A1C No results found for this basename: HGBA1C     Assessment and Plan:  Preventative Health Maintenance:  Work on diet and exercise Labs obtained today TD given today

## 2013-04-30 NOTE — Addendum Note (Signed)
Addended by: Carin Primrose on: 04/30/2013 04:52 PM   Modules accepted: Orders

## 2013-04-30 NOTE — Assessment & Plan Note (Signed)
?   Source of chest pain Will try Prilosec 29 mg daily  RTC in 1 month to reasses

## 2013-06-15 ENCOUNTER — Emergency Department (HOSPITAL_COMMUNITY)
Admission: EM | Admit: 2013-06-15 | Discharge: 2013-06-15 | Disposition: A | Discharge status: Home / Self Care | Payer: No Typology Code available for payment source | Attending: Emergency Medicine | Admitting: Emergency Medicine

## 2013-06-15 ENCOUNTER — Other Ambulatory Visit: Payer: Self-pay

## 2013-06-15 ENCOUNTER — Encounter (HOSPITAL_COMMUNITY): Payer: Self-pay | Admitting: Emergency Medicine

## 2013-06-15 ENCOUNTER — Emergency Department (HOSPITAL_COMMUNITY): Payer: No Typology Code available for payment source

## 2013-06-15 DIAGNOSIS — Z87891 Personal history of nicotine dependence: Secondary | ICD-10-CM | POA: Insufficient documentation

## 2013-06-15 DIAGNOSIS — J309 Allergic rhinitis, unspecified: Secondary | ICD-10-CM | POA: Insufficient documentation

## 2013-06-15 DIAGNOSIS — R000 Tachycardia, unspecified: Secondary | ICD-10-CM | POA: Insufficient documentation

## 2013-06-15 DIAGNOSIS — Z880 Allergy status to penicillin: Secondary | ICD-10-CM | POA: Insufficient documentation

## 2013-06-15 DIAGNOSIS — R0789 Other chest pain: Secondary | ICD-10-CM | POA: Insufficient documentation

## 2013-06-15 DIAGNOSIS — R002 Palpitations: Secondary | ICD-10-CM | POA: Insufficient documentation

## 2013-06-15 DIAGNOSIS — R0602 Shortness of breath: Secondary | ICD-10-CM | POA: Insufficient documentation

## 2013-06-15 LAB — BASIC METABOLIC PANEL
BUN: 10 mg/dL (ref 6–23)
Chloride: 94 mEq/L — ABNORMAL LOW (ref 96–112)
GFR calc Af Amer: >90 mL/min (ref >90)
GFR calc non Af Amer: 82 mL/min — ABNORMAL LOW (ref >90)
Potassium: 2.9 mEq/L — ABNORMAL LOW (ref 3.5–5.1)
Sodium: 133 mEq/L — ABNORMAL LOW (ref 135–145)

## 2013-06-15 LAB — POCT I-STAT TROPONIN I: Troponin i, poc: 0 ng/mL (ref 0.00–0.08)

## 2013-06-15 LAB — CBC
MCHC: 34.8 g/dL (ref 30.0–36.0)
Platelets: 209 10*3/uL (ref 150–400)
RDW: 12.1 % (ref 11.5–15.5)
WBC: 11.1 10*3/uL — ABNORMAL HIGH (ref 4.0–10.5)

## 2013-06-15 MED ORDER — POTASSIUM CHLORIDE CRYS ER 20 MEQ PO TBCR
40.0000 meq | EXTENDED_RELEASE_TABLET | Freq: Once | ORAL | Status: AC
  Administered 2013-06-15: 40 meq via ORAL
  Filled 2013-06-15: qty 2

## 2013-06-15 MED ORDER — IBUPROFEN 800 MG PO TABS
800.0000 mg | ORAL_TABLET | Freq: Once | ORAL | Status: AC
  Administered 2013-06-15: 800 mg via ORAL
  Filled 2013-06-15: qty 1

## 2013-06-15 NOTE — ED Notes (Signed)
Patient presents with an acute onset of nonradiating left sided CP that woke him out of sleep about 45 minutes PTA. Patient describes pain as sharp, 10/10. Episode lasted about 20 minutes then resolved without any intervention. Patient reports weakness and SOB. Denies dizziness, diaphoresis, n/v, abdominal pain or headache. Patient states that he has had similar episodes in the past. Has been seen here and evaluated by cardiologist. Reports that "they couldn't find anything."

## 2013-06-15 NOTE — ED Provider Notes (Signed)
CSN: [id number]     Arrival date & time 06/15/13  6962 History     First MD Initiated Contact with Patient 06/15/13 0451     Chief Complaint  Patient presents with  . Chest Pain   (Consider location/radiation/quality/duration/timing/severity/associated sxs/prior Treatment) HPI Comments: Patient is a 22 year old male presenting to the emergency department for acute onset central chest pressure with associated shortness of breath and palpitations that began approximately 45 minutes prior to arrival. Patient states timing remnant his pain has drastically improved down to a 4/10. Patient denies taking anything to help his pain or other symptoms. Patient states he has had episodes like this in the past and gone to the emergency department with completely negative workup. Patient was referred to cardiology where he had a workup done about a month and a half ago at [facility] and he was told he had acid reflux. Patient denies any cocaine or other recreational drug use or PDE5i. Denies any recent illnesses. PERC negative.   Patient is a 22 y.o. male presenting with chest pain.  Chest Pain Associated symptoms: shortness of breath   Associated symptoms: no fever, no headache, no nausea and not vomiting     Past Medical History  Diagnosis Date  . Seasonal allergies    History reviewed. No pertinent past surgical history. Family History  Problem Relation Age of Onset  . Hypertension Mother   . Cancer Paternal Grandfather   . Diabetes Neg Hx   . Stroke Neg Hx    History  Substance Use Topics  . Smoking status: Former Games developer  . Smokeless tobacco: Never Used  . Alcohol Use: 1.2 oz/week    1 Cans of beer, 1 Glasses of wine per week     Comment: occ    Review of Systems  Constitutional: Negative for fever and chills.  HENT: Negative.   Eyes: Negative.   Respiratory: Positive for shortness of breath.   Cardiovascular: Positive for chest pain. Negative for leg swelling.  Gastrointestinal:  Negative for nausea and vomiting.  Genitourinary: Negative.   Musculoskeletal: Negative.   Skin: Negative.   Neurological: Negative for headaches.    Allergies  Penicillins  Home Medications   Current Outpatient Rx  Name  Route  Sig  Dispense  Refill  . diphenhydrAMINE (BENADRYL) 25 MG tablet   Oral   Take 25 mg by mouth every 6 (six) hours as needed for itching or allergies.           BP 111/70  Pulse 80  Temp(Src) 97.8 F (36.6 C) (Oral)  Resp 18  SpO2 99% Physical Exam  Constitutional: He is oriented to person, place, and time. He appears well-developed and well-nourished. No distress.  HENT:  Head: Normocephalic and atraumatic.  Right Ear: External ear normal.  Left Ear: External ear normal.  Nose: Nose normal.  Eyes: Conjunctivae are normal.  Neck: Neck supple.  Cardiovascular: Normal rate, regular rhythm, normal heart sounds and intact distal pulses.   Pulmonary/Chest: Effort normal and breath sounds normal. He exhibits tenderness.    Reproducible left sided chest wall tenderness.   Abdominal: Soft. There is no tenderness.  Neurological: He is alert and oriented to person, place, and time.  Skin: Skin is warm and dry. He is not diaphoretic.    ED Course   Procedures (including critical care time)   Date: 06/15/2013  Rate: 113  Rhythm: sinus tachycardia  QRS Axis: normal  Intervals: normal  ST/T Wave abnormalities: normal  Conduction Disutrbances:none  Narrative  Interpretation:   Old EKG Reviewed: unchanged   Labs Reviewed  CBC - Abnormal; Notable for the following:    WBC 11.1 (*)    All other components within normal limits  BASIC METABOLIC PANEL - Abnormal; Notable for the following:    Sodium 133 (*)    Potassium 2.9 (*)    Chloride 94 (*)    Glucose, Bld 104 (*)    GFR calc non Af Amer 82 (*)    All other components within normal limits  POCT I-STAT TROPONIN I   Dg Chest 2 View  06/15/2013   *RADIOLOGY REPORT*  Clinical Data:  Shortness of breath, left-sided chest pain  CHEST - 2 VIEW  Comparison: 03/15/2013  Findings: Lungs are clear. No pleural effusion or pneumothorax. The cardiomediastinal contours are within normal limits. The visualized bones and soft tissues are without significant appreciable abnormality.  IMPRESSION: No radiographic evidence of an acute cardiopulmonary process.   Original Report Authenticated By: Jearld Lesch, M.D.   1. Chest pain, atypical     MDM  22 year old male presenting to the ED for palpitations. Similar episodes previously without diagnosis, going through chart review patient has still not seen cardiology, only established care with PCP not with cardiology as advised at previous ED visit.  .  EKG at triage with sinus tach. HR since regulated, now currently 80-85 when talking with pt. No further episodes of palpitations in the ED. CXR clear. Trop negative. Pt has no cardiac RF- low suspicion for ACS. Advised return to cardiology for re-evaluation. Advised PCP f/u to discuss today's visit. Return precautions advised. Patient is agreeable to plan. Patient d/w with Dr. Ethelda Chick, agrees with plan.  Patient is stable at time of discharge.     Jeannetta Ellis, PA-C 06/15/13 [phone]

## 2013-06-15 NOTE — ED Notes (Addendum)
C/o stabbing L sided chest pain that woke him up 1 hour ago.  Also c/o sob, nausea, diaphoresis, and dizziness.  Pt very anxious on arrival to ED.

## 2013-06-15 NOTE — ED Notes (Signed)
Patient transported to X-ray 

## 2013-06-24 ENCOUNTER — Emergency Department (HOSPITAL_COMMUNITY)
Admission: EM | Admit: 2013-06-24 | Discharge: 2013-06-24 | Disposition: A | Discharge status: Home / Self Care | Payer: No Typology Code available for payment source | Attending: Emergency Medicine | Admitting: Emergency Medicine

## 2013-06-24 ENCOUNTER — Encounter (HOSPITAL_COMMUNITY): Payer: Self-pay | Admitting: Emergency Medicine

## 2013-06-24 DIAGNOSIS — Z880 Allergy status to penicillin: Secondary | ICD-10-CM | POA: Insufficient documentation

## 2013-06-24 DIAGNOSIS — F419 Anxiety disorder, unspecified: Secondary | ICD-10-CM

## 2013-06-24 DIAGNOSIS — Z87891 Personal history of nicotine dependence: Secondary | ICD-10-CM | POA: Insufficient documentation

## 2013-06-24 DIAGNOSIS — F411 Generalized anxiety disorder: Secondary | ICD-10-CM | POA: Insufficient documentation

## 2013-06-24 DIAGNOSIS — R002 Palpitations: Secondary | ICD-10-CM | POA: Insufficient documentation

## 2013-06-24 DIAGNOSIS — R42 Dizziness and giddiness: Secondary | ICD-10-CM | POA: Insufficient documentation

## 2013-06-24 DIAGNOSIS — R0789 Other chest pain: Secondary | ICD-10-CM | POA: Insufficient documentation

## 2013-06-24 MED ORDER — LORAZEPAM 1 MG PO TABS: 1.0000 mg | ORAL_TABLET | Freq: Four times a day (QID) | ORAL | Status: DC | PRN

## 2013-06-24 MED ORDER — LORAZEPAM 1 MG PO TABS
1.0000 mg | ORAL_TABLET | Freq: Once | ORAL | Status: AC
  Administered 2013-06-24: 1 mg via ORAL
  Filled 2013-06-24: qty 1

## 2013-06-24 NOTE — ED Notes (Addendum)
Pt states for the past month, he has been experiencing dizzy spells, heart palpitations, and chest pain.  Has already seen a cardiologist and was told that he was fine.  Has an echo scheduled.  Pt states that he has been checking his BP at home and "sometimes it goes all the way up to 25". I asked the patient what he meant by this, he states "sometimes it goes low and sometimes it goes all the way up to 25".  I explained that his BP now is 142/88 and to describe which number was 25, pt stated "yeah, I don't know what's going on with it today".  Pt also states that he owns a stethoscope and listens to his heart.

## 2013-06-24 NOTE — ED Provider Notes (Signed)
CSN: [id number]     Arrival date & time 06/24/13  1727 History   First MD Initiated Contact with Patient 06/24/13 1847     Chief Complaint  Patient presents with  . Dizziness  . Palpitations  . Chest Pain   (Consider location/radiation/quality/duration/timing/severity/associated sxs/prior Treatment) HPI Comments: 22 year old male with no significant past medical history presents to the emergency department complaining of her palpitations, chest tightness and dizziness intermittently since May. He's been seen in the emergency department multiple times for the same, followed up with Dr. Sharyn Lull who did a full cardiac workup without any concern. He is scheduled for an echocardiogram this coming Friday. Symptoms worse when he sits for a long period of time, states he feels as if everything his chest up to his neck or tightening up, his heart starts palpitating, and then it subsides on its own shortly after. States he's been checking his blood pressure at home and it goes up to "25". He cannot explain what this means. Also states he owns a stethoscope and listened to his heart when he has the palpitations. He is currently not on any medications for any reason. States his brother has a history of the same who told him "it will go away after 5 years". Denies feeling anxious, however states the school can occasionally be stressful, he is in his last semester of college.  Patient is a 22 y.o. male presenting with palpitations and chest pain. The history is provided by the patient.  Palpitations Associated symptoms: chest pain and dizziness   Associated symptoms: no shortness of breath   Chest Pain Associated symptoms: dizziness and palpitations   Associated symptoms: no fever and no shortness of breath     Past Medical History  Diagnosis Date  . Seasonal allergies    No past surgical history on file. Family History  Problem Relation Age of Onset  . Hypertension Mother   . Cancer Paternal  Grandfather   . Diabetes Neg Hx   . Stroke Neg Hx    History  Substance Use Topics  . Smoking status: Former Games developer  . Smokeless tobacco: Never Used  . Alcohol Use: 1.2 oz/week    1 Cans of beer, 1 Glasses of wine per week     Comment: occ    Review of Systems  Constitutional: Negative for fever and chills.  Respiratory: Negative for shortness of breath.   Cardiovascular: Positive for chest pain and palpitations.  Neurological: Positive for dizziness.  Psychiatric/Behavioral: The patient is not nervous/anxious.   All other systems reviewed and are negative.    Allergies  Penicillins  Home Medications   Current Outpatient Rx  Name  Route  Sig  Dispense  Refill  . diphenhydrAMINE (BENADRYL) 25 MG tablet   Oral   Take 25 mg by mouth every 6 (six) hours as needed for itching or allergies.           BP 142/88  Pulse 90  Temp(Src) 99 F (37.2 C) (Oral)  Resp 16  SpO2 100% Physical Exam  Nursing note and vitals reviewed. Constitutional: He is oriented to person, place, and time. He appears well-developed and well-nourished. No distress.  HENT:  Head: Normocephalic and atraumatic.  Mouth/Throat: Oropharynx is clear and moist.  Eyes: Conjunctivae and EOM are normal. Pupils are equal, round, and reactive to light.  Neck: Normal range of motion. Neck supple.  Cardiovascular: Normal rate, regular rhythm, normal heart sounds and intact distal pulses.   Pulmonary/Chest: Effort normal and breath  sounds normal. No respiratory distress. He has no wheezes. He has no rales. He exhibits no tenderness.  Abdominal: Soft. Bowel sounds are normal. There is no tenderness.  Musculoskeletal: Normal range of motion. He exhibits no edema.  Neurological: He is alert and oriented to person, place, and time.  Skin: Skin is warm and dry. He is not diaphoretic.  Psychiatric: His behavior is normal. His mood appears anxious.    ED Course  Procedures (including critical care time) Labs  Review Labs Reviewed - No data to display Imaging Review No results found.   Date: 06/24/2013  Rate: 90  Rhythm: normal sinus rhythm  QRS Axis: normal  Intervals: normal  ST/T Wave abnormalities: normal  Conduction Disutrbances:none  Narrative Interpretation: baseline wander leads v1, v2  Old EKG Reviewed: unchanged   MDM   1. Anxiety   2. Chest pain, atypical      Patient with chest pain, heart palpitations, dizziness. This is been going on since May. He is currently under the care of Dr. Sharyn Lull, had a cardiac workup, echo scheduled for Friday. Patient's symptoms are consistent with anxiety. I discussed this with patient who is agreeable to trying Ativan at times of palpitations. EKG unremarkable. I do not feel any further workup is necessary at this time. He is stable for discharge.   Trevor Mace, PA-C 06/24/13 1929

## 2013-06-26 NOTE — ED Provider Notes (Signed)
Medical screening examination/treatment/procedure(s) were performed by non-physician practitioner and as supervising physician I was immediately available for consultation/collaboration.  Doug Sou, MD 06/26/13 1447

## 2013-06-26 NOTE — ED Provider Notes (Signed)
Medical screening examination/treatment/procedure(s) were performed by non-physician practitioner and as supervising physician I was immediately available for consultation/collaboration.  Jaymi Tinner T Ariya Bohannon, MD 06/26/13 0756 

## 2013-07-01 ENCOUNTER — Ambulatory Visit: Payer: No Typology Code available for payment source | Admitting: Cardiology

## 2013-08-14 ENCOUNTER — Encounter (HOSPITAL_COMMUNITY): Payer: Self-pay | Admitting: Emergency Medicine

## 2013-08-14 ENCOUNTER — Emergency Department (INDEPENDENT_AMBULATORY_CARE_PROVIDER_SITE_OTHER)
Admission: EM | Admit: 2013-08-14 | Discharge: 2013-08-14 | Disposition: A | Discharge status: Home / Self Care | Payer: No Typology Code available for payment source | Source: Home / Self Care

## 2013-08-14 DIAGNOSIS — R002 Palpitations: Secondary | ICD-10-CM

## 2013-08-14 DIAGNOSIS — F411 Generalized anxiety disorder: Secondary | ICD-10-CM

## 2013-08-14 DIAGNOSIS — F419 Anxiety disorder, unspecified: Secondary | ICD-10-CM

## 2013-08-14 MED ORDER — PROPRANOLOL HCL 10 MG PO TABS: ORAL_TABLET | ORAL | Status: DC

## 2013-08-14 NOTE — ED Provider Notes (Signed)
CSN: [id number]     Arrival date & time 08/14/13  1532 History   First MD Initiated Contact with Patient 08/14/13 1559     Chief Complaint  Patient presents with  . Panic Attack   (Consider location/radiation/quality/duration/timing/severity/associated sxs/prior Treatment) HPI Comments: 22 year old male is complaining of dizziness and palpitations. He has presented to the emergency department multiple times for the same complaint. They referred to a cardiologist he had a stress test echocardiogram and other tests and was found to have a normal functioning heart. At one time he saw a physician I believe to be a PCP, and was given lorazepam 1 mg every 6 hours as needed for anxiety. He said that that helped some. Today he has had more palpitations and variable heart rate that has reached 110 per minute. It lasts anywhere from 3-4 hours at a time. He denies chest pain or shortness of breath but does have occasional dizziness.   Past Medical History  Diagnosis Date  . Seasonal allergies    History reviewed. No pertinent past surgical history. Family History  Problem Relation Age of Onset  . Hypertension Mother   . Cancer Paternal Grandfather   . Diabetes Neg Hx   . Stroke Neg Hx    History  Substance Use Topics  . Smoking status: Former Games developer  . Smokeless tobacco: Never Used  . Alcohol Use: 1.2 oz/week    1 Cans of beer, 1 Glasses of wine per week     Comment: occ    Review of Systems  Constitutional: Positive for activity change. Negative for fever.  HENT: Negative.   Respiratory: Negative for chest tightness, shortness of breath and wheezing.   Cardiovascular: Positive for palpitations. Negative for chest pain and leg swelling.  Gastrointestinal: Negative.   Musculoskeletal: Negative.   Neurological: Positive for dizziness. Negative for seizures, syncope, speech difficulty, weakness and headaches.  Psychiatric/Behavioral: The patient is nervous/anxious.     Allergies   Penicillins  Home Medications   Current Outpatient Rx  Name  Route  Sig  Dispense  Refill  . diphenhydrAMINE (BENADRYL) 25 MG tablet   Oral   Take 25 mg by mouth every 6 (six) hours as needed for itching or allergies.          Marland Kitchen LORazepam (ATIVAN) 1 MG tablet   Oral   Take 1 tablet (1 mg total) by mouth every 6 (six) hours as needed for anxiety.   10 tablet   0   . propranolol (INDERAL) 10 MG tablet      1 tablet every 8 hours as needed for palpitations   30 tablet   0    BP 129/83  Pulse 67  Temp(Src) 98.7 F (37.1 C) (Oral)  Resp 16  SpO2 97% Physical Exam  Nursing note and vitals reviewed. Constitutional: He is oriented to person, place, and time. He appears well-developed and well-nourished. No distress.  HENT:  Head: Normocephalic and atraumatic.  Eyes: Conjunctivae and EOM are normal.  Neck: Normal range of motion. Neck supple.  Cardiovascular: Normal rate, regular rhythm and normal heart sounds.   Pulmonary/Chest: Effort normal and breath sounds normal. No respiratory distress. He has no wheezes. He has no rales.  Musculoskeletal: Normal range of motion. He exhibits no edema.  Lymphadenopathy:    He has no cervical adenopathy.  Neurological: He is alert and oriented to person, place, and time. No cranial nerve deficit. He exhibits normal muscle tone.  Skin: Skin is warm and dry. No rash  noted.  Psychiatric: He has a normal mood and affect.    ED Course  Procedures (including critical care time) Labs Review Labs Reviewed - No data to display Imaging Review No results found.    MDM   1. Heart palpitations   2. Anxiety    Upon examination his apical pulse is regular rate and rhythm at 72. He states that he feels palpitations at the same time.  Propranolol 10 mg q 8 h prn palpitations. Must obtain a PCP for follow up  Hayden Rasmussen, NP 08/14/13 1642

## 2013-08-14 NOTE — ED Notes (Signed)
C/o anxiety attack.  Patient states he feels himself getting anxious and will try to control it but recently has not been able to.

## 2013-08-17 ENCOUNTER — Emergency Department (HOSPITAL_COMMUNITY): Payer: No Typology Code available for payment source

## 2013-08-17 ENCOUNTER — Encounter (HOSPITAL_COMMUNITY): Payer: Self-pay | Admitting: Emergency Medicine

## 2013-08-17 DIAGNOSIS — F411 Generalized anxiety disorder: Secondary | ICD-10-CM | POA: Insufficient documentation

## 2013-08-17 DIAGNOSIS — R002 Palpitations: Secondary | ICD-10-CM | POA: Insufficient documentation

## 2013-08-17 DIAGNOSIS — R000 Tachycardia, unspecified: Secondary | ICD-10-CM | POA: Insufficient documentation

## 2013-08-17 DIAGNOSIS — Z87891 Personal history of nicotine dependence: Secondary | ICD-10-CM | POA: Insufficient documentation

## 2013-08-17 DIAGNOSIS — R42 Dizziness and giddiness: Secondary | ICD-10-CM | POA: Insufficient documentation

## 2013-08-17 DIAGNOSIS — R0789 Other chest pain: Secondary | ICD-10-CM | POA: Insufficient documentation

## 2013-08-17 DIAGNOSIS — J309 Allergic rhinitis, unspecified: Secondary | ICD-10-CM | POA: Insufficient documentation

## 2013-08-17 DIAGNOSIS — Z880 Allergy status to penicillin: Secondary | ICD-10-CM | POA: Insufficient documentation

## 2013-08-17 LAB — CBC
HCT: 42 % (ref 39.0–52.0)
Hemoglobin: 15.1 g/dL (ref 13.0–17.0)
MCH: 30.7 pg (ref 26.0–34.0)
MCHC: 36 g/dL (ref 30.0–36.0)
RBC: 4.92 MIL/uL (ref 4.22–5.81)

## 2013-08-17 LAB — BASIC METABOLIC PANEL
BUN: 12 mg/dL (ref 6–23)
CO2: 25 mEq/L (ref 19–32)
Calcium: 9.6 mg/dL (ref 8.4–10.5)
GFR calc non Af Amer: 83 mL/min — ABNORMAL LOW (ref >90)
Glucose, Bld: 92 mg/dL (ref 70–99)
Potassium: 3.2 mEq/L — ABNORMAL LOW (ref 3.5–5.1)
Sodium: 133 mEq/L — ABNORMAL LOW (ref 135–145)

## 2013-08-17 LAB — POCT I-STAT TROPONIN I

## 2013-08-17 NOTE — ED Notes (Signed)
Pt.reports intermittent mid chest pain onset last night with dizziness and lightheaded , denies SOB / nausea or diaphoresis .

## 2013-08-18 ENCOUNTER — Emergency Department (HOSPITAL_COMMUNITY)
Admission: EM | Admit: 2013-08-18 | Discharge: 2013-08-18 | Disposition: A | Discharge status: Home / Self Care | Payer: No Typology Code available for payment source | Attending: Emergency Medicine | Admitting: Emergency Medicine

## 2013-08-18 DIAGNOSIS — R0789 Other chest pain: Secondary | ICD-10-CM

## 2013-08-18 HISTORY — DX: Anxiety disorder, unspecified: F41.9

## 2013-08-18 NOTE — ED Provider Notes (Signed)
Medical screening examination/treatment/procedure(s) were performed by resident physician or non-physician practitioner and as supervising physician I was immediately available for consultation/collaboration.   Chamara Dyck DOUGLAS MD.   Leonore Frankson D Tosh Glaze, MD 08/18/13 0935 

## 2013-08-18 NOTE — ED Provider Notes (Signed)
CSN: [id number]     Arrival date & time 08/17/13  2021 History   First MD Initiated Contact with Patient 08/18/13 0202     Chief Complaint  Patient presents with  . Chest Pain   HPI  History provided by the patient. Patient is a 22 year old male with a history of seasonal allergies and anxiety who presents with concerns for chest discomfort and fast heartbeat. Patient states he was laying down to try to rest and sleep when he suddenly woke up feeling his heart racing and having some central and left chest discomforts and pains. Patient states he's had similar episodes for the past several weeks to month. He has been evaluated by cardiology specialist reports having echocardiogram done as well as wearing a Holter monitor. Patient actually returned doubled her monitor today to the office and was told there was no concerning findings to explain symptoms. Patient states he's been told that he may have anxiety and panic attacks causing his symptoms. Currently his symptoms are improved and the ports very minimal discomforts his chest. He denies having any associated shortness of breath or diaphoresis. No nausea. No vomiting. No other aggravating or alleviating factors. No other associated symptoms.    Past Medical History  Diagnosis Date  . Seasonal allergies   . Anxiety    History reviewed. No pertinent past surgical history. Family History  Problem Relation Age of Onset  . Hypertension Mother   . Cancer Paternal Grandfather   . Diabetes Neg Hx   . Stroke Neg Hx    History  Substance Use Topics  . Smoking status: Former Games developer  . Smokeless tobacco: Never Used  . Alcohol Use: 1.2 oz/week    1 Glasses of wine, 1 Cans of beer per week     Comment: occ    Review of Systems  Constitutional: Negative for fever.  Respiratory: Negative for shortness of breath.   Cardiovascular: Positive for chest pain and palpitations.  Neurological: Positive for light-headedness. Negative for syncope,  weakness, numbness and headaches.  All other systems reviewed and are negative.    Allergies  Penicillins  Home Medications   Current Outpatient Rx  Name  Route  Sig  Dispense  Refill  . diphenhydrAMINE (BENADRYL) 25 MG tablet   Oral   Take 25 mg by mouth every 6 (six) hours as needed for itching or allergies.          . hydrocortisone cream 1 %   Topical   Apply 1 application topically 2 (two) times daily as needed (for rash).          BP 119/65  Pulse 65  Temp(Src) 98.8 F (37.1 C) (Oral)  Resp 14  Ht 6\' 2"  (1.88 m)  Wt 265 lb 4 oz (120.317 kg)  BMI 34.04 kg/m2  SpO2 96% Physical Exam  Nursing note and vitals reviewed. Constitutional: He is oriented to person, place, and time. He appears well-developed and well-nourished. No distress.  HENT:  Head: Normocephalic.  Cardiovascular: Normal rate and regular rhythm.   Pulmonary/Chest: Effort normal and breath sounds normal. No respiratory distress. He has no wheezes. He has no rales. He exhibits tenderness.  There is some reproducible tenderness to palpation over the anterior left chest. No deformities. No crepitus.  Abdominal: Soft. There is no tenderness. There is no rebound and no guarding.  Musculoskeletal: Normal range of motion. He exhibits no edema and no tenderness.  No clinical concerns for DVT  Neurological: He is alert and oriented to  person, place, and time.  Skin: Skin is warm.  Psychiatric: He has a normal mood and affect. His behavior is normal.    ED Course  Procedures   DIAGNOSTIC STUDIES: Oxygen Saturation is 98% on room air.    COORDINATION OF CARE:  Nursing notes reviewed. Vital signs reviewed. Initial pt interview and examination performed.   2:20 AM-patient seen and evaluated. He has improvement of symptoms denies any significant pains or discomforts at this time. Patient has been evaluated for similar symptoms recently with cardiology. No concerning or acute findings today in emergency  room workup. He is PERC negative.  At this time he is stable for discharge home. Pt agrees with plan.   Results for orders placed during the hospital encounter of 08/18/13  CBC      Result Value Range   WBC 12.8 (*) 4.0 - 10.5 K/uL   RBC 4.92  4.22 - 5.81 MIL/uL   Hemoglobin 15.1  13.0 - 17.0 g/dL   HCT 16.1  09.6 - 04.5 %   MCV 85.4  78.0 - 100.0 fL   MCH 30.7  26.0 - 34.0 pg   MCHC 36.0  30.0 - 36.0 g/dL   RDW 40.9  81.1 - 91.4 %   Platelets 214  150 - 400 K/uL  BASIC METABOLIC PANEL      Result Value Range   Sodium 133 (*) 135 - 145 mEq/L   Potassium 3.2 (*) 3.5 - 5.1 mEq/L   Chloride 98  96 - 112 mEq/L   CO2 25  19 - 32 mEq/L   Glucose, Bld 92  70 - 99 mg/dL   BUN 12  6 - 23 mg/dL   Creatinine, Ser 7.82  0.50 - 1.35 mg/dL   Calcium 9.6  8.4 - 95.6 mg/dL   GFR calc non Af Amer 83 (*) >90 mL/min   GFR calc Af Amer >90  >90 mL/min  POCT I-STAT TROPONIN I      Result Value Range   Troponin i, poc 0.00  0.00 - 0.08 ng/mL   Comment 3              Imaging Review Dg Chest 2 View  08/17/2013   CLINICAL DATA:  Chest pain on and off for a long time per pt(relates it to anxiety) states chest pain has worsened in last 2 days  EXAM: CHEST  2 VIEW  COMPARISON:  None.  FINDINGS: The heart size and mediastinal contours are within normal limits. Both lungs are clear. The visualized skeletal structures are unremarkable.  IMPRESSION: No active cardiopulmonary disease.   Electronically Signed   By: Oley Balm M.D.   On: 08/17/2013 21:23    EKG Interpretation   None      Date: 08/18/2013  Rate: 88  Rhythm: normal sinus rhythm  QRS Axis: normal  Intervals: normal  ST/T Wave abnormalities: normal  Conduction Disutrbances:none  Narrative Interpretation:   Old EKG Reviewed: unchanged     MDM   1. Atypical chest pain        Angus Seller, PA-C 08/18/13 0225

## 2013-08-18 NOTE — ED Notes (Signed)
Pt has been seen by cardiologist 1 month ago and f/u today.  And has worn halter monitor for last month. Pt states that they didn't find anything. Pt has RX for lorazepam and toporol that he doesn't take

## 2013-08-18 NOTE — ED Provider Notes (Signed)
Medical screening examination/treatment/procedure(s) were performed by non-physician practitioner and as supervising physician I was immediately available for consultation/collaboration.    Olivia Mackie, MD 08/18/13 [phone]

## 2013-08-21 ENCOUNTER — Emergency Department (HOSPITAL_COMMUNITY)
Admission: EM | Admit: 2013-08-21 | Discharge: 2013-08-21 | Disposition: A | Discharge status: Home / Self Care | Payer: No Typology Code available for payment source | Attending: Emergency Medicine | Admitting: Emergency Medicine

## 2013-08-21 ENCOUNTER — Encounter (HOSPITAL_COMMUNITY): Payer: Self-pay | Admitting: Emergency Medicine

## 2013-08-21 DIAGNOSIS — F419 Anxiety disorder, unspecified: Secondary | ICD-10-CM

## 2013-08-21 DIAGNOSIS — R42 Dizziness and giddiness: Secondary | ICD-10-CM | POA: Insufficient documentation

## 2013-08-21 DIAGNOSIS — R079 Chest pain, unspecified: Secondary | ICD-10-CM | POA: Insufficient documentation

## 2013-08-21 DIAGNOSIS — Z880 Allergy status to penicillin: Secondary | ICD-10-CM | POA: Insufficient documentation

## 2013-08-21 DIAGNOSIS — R002 Palpitations: Secondary | ICD-10-CM | POA: Insufficient documentation

## 2013-08-21 DIAGNOSIS — Z87891 Personal history of nicotine dependence: Secondary | ICD-10-CM | POA: Insufficient documentation

## 2013-08-21 DIAGNOSIS — F411 Generalized anxiety disorder: Secondary | ICD-10-CM | POA: Insufficient documentation

## 2013-08-21 NOTE — ED Provider Notes (Signed)
TIME SEEN: 6:57 PM  CHIEF COMPLAINT: Palpitations, chest pain, dizziness  HPI: Patient is a 22 y.o. male with a history of anxiety who presents the emergency department for concerns for lightheadedness, palpitations and chest pain that have been present for the past several months. He reports that he had episode today and he took his pulse and it was greater than 100. He is also concerned as his systolic blood pressure has been in the 140s. He describes his pain as a discomfort but denies any current pain. No shortness of breath. No recent fever or cough. No vomiting or diarrhea. No bloody stools or melena. No history of prolonged immobilization, hospitalization, surgery, trauma, fracture, history of PE or DVT, lower extremity swelling or pain. No history of hypertension, diabetes, hyperlipidemia or tobacco use. No family history of premature CAD.  Patient seen in the ED on 08/17/13 and had negative troponin, chest x-ray. His labs are unremarkable other than a slight leukocytosis of 12.8.  Patient reports that he has been seen by a cardiologist and has had normal EKGs, echocardiogram and has worn a Holter monitor that did not find any abnormalities when patient was symptomatic. Patient reports that he has been prescribed lorazepam which she takes with some benefit but has not been taking it regularly.  ROS: See HPI Constitutional: no fever  Eyes: no drainage  ENT: no runny nose   Cardiovascular:  chest pain  Resp: no SOB  GI: no vomiting GU: no dysuria Integumentary: no rash  Allergy: no hives  Musculoskeletal: no leg swelling  Neurological: no slurred speech ROS otherwise negative  PAST MEDICAL HISTORY/PAST SURGICAL HISTORY:  Past Medical History  Diagnosis Date  . Seasonal allergies   . Anxiety     MEDICATIONS:  Prior to Admission medications   Medication Sig Start Date End Date Taking? Authorizing Provider  diphenhydrAMINE (BENADRYL) 25 MG tablet Take 25 mg by mouth every 6 (six)  hours as needed for itching or allergies.    Yes Historical Provider, MD    ALLERGIES:  Allergies  Allergen Reactions  . Penicillins Rash    SOCIAL HISTORY:  History  Substance Use Topics  . Smoking status: Former Games developer  . Smokeless tobacco: Never Used  . Alcohol Use: 1.2 oz/week    1 Glasses of wine, 1 Cans of beer per week     Comment: occ    FAMILY HISTORY: Family History  Problem Relation Age of Onset  . Hypertension Mother   . Cancer Paternal Grandfather   . Diabetes Neg Hx   . Stroke Neg Hx     EXAM: BP 132/91  Pulse 92  Temp(Src) 98.9 F (37.2 C) (Oral)  Resp 16  SpO2 100% CONSTITUTIONAL: Alert and oriented and responds appropriately to questions. Well-appearing; well-nourished HEAD: Normocephalic EYES: Conjunctivae clear, PERRL ENT: normal nose; no rhinorrhea; moist mucous membranes; pharynx without lesions noted NECK: Supple, no meningismus, no LAD  CARD: RRR; S1 and S2 appreciated; no murmurs, no clicks, no rubs, no gallops RESP: Normal chest excursion without splinting or tachypnea; breath sounds clear and equal bilaterally; no wheezes, no rhonchi, no rales,  ABD/GI: Normal bowel sounds; non-distended; soft, non-tender, no rebound, no guarding BACK:  The back appears normal and is non-tender to palpation, there is no CVA tenderness EXT: Normal ROM in all joints; non-tender to palpation; no edema; normal capillary refill; no cyanosis    SKIN: Normal color for age and race; warm NEURO: Moves all extremities equally PSYCH: The patient's mood and manner  are appropriate. Grooming and personal hygiene are appropriate.  MEDICAL DECISION MAKING: Patient here with palpitations, dizziness and chest pain that he's had for over a month. He has no risk factors for ACS. He is PERC negative.  He is hemodynamically stable. EKG is completely normal. He's been followed by a cardiologist and has had an echocardiogram and worn a Holter monitor did not reveal any abnormality.  Discussed with patient at length and I do not feel there is a life-threatening illness today. I recommended that he followup with a primary care physician for close followup. Patient may need longer acting anxiolytics. I do not feel any further workup is needed today given he has had multiple negative workups in the past including 4 days ago. Patient verbalizes understanding and is comfortable this plan.    EKG Interpretation     Ventricular Rate:  92 PR Interval:  160 QRS Duration: 107 QT Interval:  365 QTC Calculation: 451 R Axis:   79 Text Interpretation:  Sinus rhythm Baseline wander in lead(s) V3              Deshanna Kama N Jaleeah Slight, DO 08/21/13 1955

## 2013-08-21 NOTE — ED Notes (Signed)
Pt complains of dizziness when he sits for half and hour, but when he walks he has palpitations. Pt states this has been going on for over a month but worse today. Pt states he has been worked up by cardiology, but no diagnosis besides anxiety found.  Pt states he has dizziness now and palpitations and chest pain now.

## 2013-08-27 ENCOUNTER — Other Ambulatory Visit: Payer: Self-pay

## 2013-09-06 ENCOUNTER — Encounter (HOSPITAL_COMMUNITY): Payer: Self-pay | Admitting: Emergency Medicine

## 2013-09-06 ENCOUNTER — Emergency Department (HOSPITAL_COMMUNITY)
Admission: EM | Admit: 2013-09-06 | Discharge: 2013-09-06 | Disposition: A | Discharge status: Home / Self Care | Payer: No Typology Code available for payment source | Attending: Emergency Medicine | Admitting: Emergency Medicine

## 2013-09-06 ENCOUNTER — Emergency Department (HOSPITAL_COMMUNITY): Payer: No Typology Code available for payment source

## 2013-09-06 ENCOUNTER — Emergency Department (HOSPITAL_COMMUNITY)
Admission: EM | Admit: 2013-09-06 | Discharge: 2013-09-07 | Disposition: A | Discharge status: Home / Self Care | Payer: No Typology Code available for payment source | Attending: Emergency Medicine | Admitting: Emergency Medicine

## 2013-09-06 DIAGNOSIS — Z880 Allergy status to penicillin: Secondary | ICD-10-CM | POA: Insufficient documentation

## 2013-09-06 DIAGNOSIS — Z8709 Personal history of other diseases of the respiratory system: Secondary | ICD-10-CM | POA: Insufficient documentation

## 2013-09-06 DIAGNOSIS — R079 Chest pain, unspecified: Secondary | ICD-10-CM

## 2013-09-06 DIAGNOSIS — Z87891 Personal history of nicotine dependence: Secondary | ICD-10-CM | POA: Insufficient documentation

## 2013-09-06 DIAGNOSIS — F411 Generalized anxiety disorder: Secondary | ICD-10-CM | POA: Insufficient documentation

## 2013-09-06 DIAGNOSIS — E860 Dehydration: Secondary | ICD-10-CM | POA: Insufficient documentation

## 2013-09-06 DIAGNOSIS — R0789 Other chest pain: Secondary | ICD-10-CM | POA: Insufficient documentation

## 2013-09-06 DIAGNOSIS — R071 Chest pain on breathing: Secondary | ICD-10-CM | POA: Insufficient documentation

## 2013-09-06 LAB — POCT I-STAT, CHEM 8
BUN: 24 mg/dL — ABNORMAL HIGH (ref 6–23)
Chloride: 98 mEq/L (ref 96–112)
Creatinine, Ser: 1.5 mg/dL — ABNORMAL HIGH (ref 0.50–1.35)
Hemoglobin: 16.7 g/dL (ref 13.0–17.0)
Sodium: 139 mEq/L (ref 135–145)
TCO2: 29 mmol/L (ref 0–100)

## 2013-09-06 LAB — POCT I-STAT TROPONIN I: Troponin i, poc: 0 ng/mL (ref 0.00–0.08)

## 2013-09-06 MED ORDER — TRAMADOL HCL 50 MG PO TABS
50.0000 mg | ORAL_TABLET | Freq: Once | ORAL | Status: AC
  Administered 2013-09-06: 50 mg via ORAL
  Filled 2013-09-06: qty 1

## 2013-09-06 MED ORDER — TRAMADOL HCL 50 MG PO TABS: 50.0000 mg | ORAL_TABLET | Freq: Four times a day (QID) | ORAL | Status: DC | PRN

## 2013-09-06 MED ORDER — IBUPROFEN 600 MG PO TABS: 600.0000 mg | ORAL_TABLET | Freq: Four times a day (QID) | ORAL | Status: DC | PRN

## 2013-09-06 NOTE — ED Notes (Addendum)
Pt states that he has had CP in the past and was diagnosed with anxiety. Pt states that today he woke up at 12:00 and was having CP. Pt states that his pain is right sided and does not radiate. Pt states that he has been worried about his pulse and how slow it was during the day. the pain but not stressed lately.

## 2013-09-06 NOTE — ED Notes (Signed)
Pt given d/c instructions and verbalized understanding. NAD at this time.  

## 2013-09-06 NOTE — ED Provider Notes (Signed)
CSN: [id number]     Arrival date & time 09/06/13  2213 History  This chart was scribed for non-physician practitioner, Arthor Captain, PA-C,working with Hanley Seamen, MD, by Karle Plumber, ED Scribe.  This patient was seen in room WTR2/WLPT2 and the patient's care was started at 11:42 PM.  Chief Complaint  Patient presents with  . Chest Pain   The history is provided by the patient. No language interpreter was used.   HPI Comments:  Gary Chavez is a 22 y.o. male who presents to the Emergency Department complaining of chest pain and right wrist pain onset 24 hours. He states his symptoms have been intermittent for approximately 3.5 months. He states he has been treated with antacids for GERD. Pt resented to [facility] earlier this morning and was prescribed pain medication. He states he went home and took a nap. He states at around 6 PM the pain returned. He states he went for a walk and did not experience any increase in pain or SOB.He also had relief of his chest pain with walking. Pt states once he got home and rested, his pain intensified. Pt states the pain is worsened when he lies down or raises his arms. Pt states his pain is currently lessened from 6/10 since he has been here. Pt states he used to be very active in the gym, but since his anxiety and chest pain started he has not been going as often. Pt has family h/o HTN. He states he experiences brief periods of HTN, but nothing chronic. He  he checks is BP at home and it was concerned because it was 120/60. He was concerned as he felt this was low.   Past Medical History  Diagnosis Date  . Seasonal allergies   . Anxiety    History reviewed. No pertinent past surgical history. Family History  Problem Relation Age of Onset  . Hypertension Mother   . Cancer Paternal Grandfather   . Diabetes Neg Hx   . Stroke Neg Hx    History  Substance Use Topics  . Smoking status: Former Games developer  . Smokeless tobacco: Never Used  .  Alcohol Use: 1.2 oz/week    1 Glasses of wine, 1 Cans of beer per week     Comment: occ    Review of Systems  Constitutional: Negative for fever.  HENT: Negative for congestion.   Respiratory: Negative for cough.   Cardiovascular: Positive for chest pain.  Gastrointestinal: Negative for nausea, vomiting and diarrhea.  Psychiatric/Behavioral: The patient is nervous/anxious.     Allergies  Penicillins  Home Medications   Current Outpatient Rx  Name  Route  Sig  Dispense  Refill  . ibuprofen (ADVIL,MOTRIN) 600 MG tablet   Oral   Take 1 tablet (600 mg total) by mouth every 6 (six) hours as needed.   30 tablet   0   . traMADol (ULTRAM) 50 MG tablet   Oral   Take 1 tablet (50 mg total) by mouth every 6 (six) hours as needed.   15 tablet   0    Triage Vitals: BP 125/79  Pulse 80  Temp(Src) 98.7 F (37.1 C) (Oral)  Resp 20  SpO2 100% Physical Exam  Nursing note and vitals reviewed. Constitutional: He is oriented to person, place, and time. He appears well-developed and well-nourished. No distress.  HENT:  Head: Normocephalic and atraumatic.  Eyes: Conjunctivae are normal. No scleral icterus.  Neck: Neck supple.  Cardiovascular: Normal rate and intact distal pulses.  Pulmonary/Chest: Effort normal. No stridor. No respiratory distress. He exhibits tenderness.  Tender to palpation of left chest wall with movement or extension.   Abdominal: Normal appearance. He exhibits no distension.  Neurological: He is alert and oriented to person, place, and time.  Skin: Skin is warm and dry. No rash noted.  Psychiatric: He has a normal mood and affect. His behavior is normal.    ED Course  Procedures (including critical care time) DIAGNOSTIC STUDIES: Oxygen Saturation is 100% on RA, normal by my interpretation.   COORDINATION OF CARE: 11:59 PM- Will obtain lab work. Advised pt to ice/heat chest wall and take anti-inflammatory medications for possible costochondritis. Pt  verbalizes understanding and agrees to plan.  Medications - No data to display  Labs Review Labs Reviewed  POCT I-STAT, CHEM 8 - Abnormal; Notable for the following:    BUN 24 (*)    Creatinine, Ser 1.50 (*)    All other components within normal limits  POCT I-STAT TROPONIN I   Imaging Review Dg Chest 2 View  09/06/2013   CLINICAL DATA:  Left-sided chest pain.  EXAM: CHEST  2 VIEW  COMPARISON:  Chest radiograph performed 08/17/2013  FINDINGS: The lungs are well-aerated and clear. There is no evidence of focal opacification, pleural effusion or pneumothorax.  The heart is normal in size; the mediastinal contour is within normal limits. No acute osseous abnormalities are seen.  IMPRESSION: No active cardiopulmonary disease.   Electronically Signed   By: Roanna Raider M.D.   On: 09/06/2013 05:34    EKG Interpretation   None      ECG interpretation   Date: 09/07/2013  Rate: 80  Rhythm: normal sinus rhythm  QRS Axis: normal  Intervals: normal  ST/T Wave abnormalities: normal  Conduction Disutrbances: none  Narrative Interpretation:   Old EKG Reviewed: No significant changes noted    MDM   1. Chest pain    Patient with chest wall pain. Counseled on sxs of ACS . No further work up indicated at this time.  Discussed differential. Spent approx 20 min counseling and reassuring the patient. Slight elevation of BUN/Creatinine- appears dehydrated.  Patient is to be discharged with recommendation to follow up with PCP in regards to today's hospital visit. Chest pain is not likely of cardiac or pulmonary etiology d/t presentation, perc negative, VSS, no tracheal deviation, no JVD or new murmur, RRR, breath sounds equal bilaterally, EKG without acute abnormalities, negative troponin. Pt has been advised start a PPI and return to the ED is CP becomes exertional, associated with diaphoresis or nausea, radiates to left jaw/arm, worsens or becomes concerning in any way. Pt appears  reliable for follow up and is agreeable to discharge.     I personally performed the services described in this documentation, which was scribed in my presence. The recorded information has been reviewed and is accurate.  Arthor Captain, PA-C 09/07/13 1447

## 2013-09-06 NOTE — ED Provider Notes (Signed)
CSN: [id number]     Arrival date & time 09/06/13  0447 History   First MD Initiated Contact with Patient 09/06/13 0457     Chief Complaint  Patient presents with  . Chest Pain   (Consider location/radiation/quality/duration/timing/severity/associated sxs/prior Treatment) HPI This patient is a generally healthy 22 year old man who presents to the emergency department with complaints of a heavy feeling the left side of his chest. He says he has this sensation frequently and has been evaluated several times in the past and this emergency department. Chart review confirms this.  The patient says he's been previously diagnosed with anxiety and panic attacks. Indeed, he endorses a history of anxiety and panic attacks. He denies cocaine use. He does not currently smoke cigarettes.  Patient says that his pain is mild. However, he became concerned when he checked his pulse it seemed to be slower than usual. The patient asked his significant other to check his pulse. His significant other states that he felt that the patient's pulse was not as strong as it typically is.  The patient denies any sensation of lightheadedness. He did not actually count his pulse city can't tell me what his heart rate was when he checked his pulse. He denies any shortness of breath. No abdominal pain, nausea, vomiting or diaphoresis. No history of structural heart or coronary artery disease  Past Medical History  Diagnosis Date  . Seasonal allergies   . Anxiety    History reviewed. No pertinent past surgical history. Family History  Problem Relation Age of Onset  . Hypertension Mother   . Cancer Paternal Grandfather   . Diabetes Neg Hx   . Stroke Neg Hx    History  Substance Use Topics  . Smoking status: Former Games developer  . Smokeless tobacco: Never Used  . Alcohol Use: 1.2 oz/week    1 Glasses of wine, 1 Cans of beer per week     Comment: occ    Review of Systems 10 point review of systems obtained and is  negative with the exception of symptoms noted above.   Allergies  Penicillins  Home Medications   Current Outpatient Rx  Name  Route  Sig  Dispense  Refill  . ibuprofen (ADVIL,MOTRIN) 600 MG tablet   Oral   Take 1 tablet (600 mg total) by mouth every 6 (six) hours as needed.   30 tablet   0   . traMADol (ULTRAM) 50 MG tablet   Oral   Take 1 tablet (50 mg total) by mouth every 6 (six) hours as needed.   15 tablet   0    BP 118/71  Pulse 75  Temp(Src) 97.8 F (36.6 C) (Oral)  Resp 16  Ht 6\' 2"  (1.88 m)  Wt 265 lb (120.203 kg)  BMI 34.01 kg/m2  SpO2 100% Physical Exam Gen: well developed and well nourished appearing Head: NCAT Eyes: PERL, EOMI Nose: no epistaixis or rhinorrhea Mouth/throat: mucosa is moist and pink Neck: supple, no stridor Lungs: CTA B, no wheezing, rhonchi or rales CV: RRR, no murmur, good extremity pulses  Chest wall: nontender Abd: soft, notender, nondistended Back: no midline ttp Skin: warm and dry, no lesions Neuro: CN ii-xii grossly intact, no focal deficits, normal speech, normal gait.  Psyche; normal affect,  calm and cooperative.   ED Course  Procedures (including critical care time) Labs Review Labs Reviewed - No data to display Imaging Review Dg Chest 2 View  09/06/2013   CLINICAL DATA:  Left-sided chest pain.  EXAM: CHEST  2 VIEW  COMPARISON:  Chest radiograph performed 08/17/2013  FINDINGS: The lungs are well-aerated and clear. There is no evidence of focal opacification, pleural effusion or pneumothorax.  The heart is normal in size; the mediastinal contour is within normal limits. No acute osseous abnormalities are seen.  IMPRESSION: No active cardiopulmonary disease.   Electronically Signed   By: Roanna Raider M.D.   On: 09/06/2013 05:34    EKG Interpretation     Ventricular Rate:  76 PR Interval:  230 QRS Duration: 101 QT Interval:  380 QTC Calculation: 427 R Axis:   59 Text Interpretation:  Sinus rhythm Left atrial  enlargement Lateral infarct, acute No lateral infarct appreciated. No change from EKG 07/2013.             MDM   1. Chest pain    Normal exam. Normal VS. No risk factors for CAD or VTE. Multiple previous visits for same symptoms have resulted in full work ups which were unremarkable. Patient reassured that his heart rate is wnl and his pulse is wnl. He is stable for d/c with plan for symptomatic management and reassurance.     Brandt Loosen, MD 09/06/13 [phone]

## 2013-09-06 NOTE — ED Notes (Signed)
Per pt, started having chest pain last pm.  Pain is worse when sitting.  Also noted pains to right wrist.  No n/v/d.  No fever, cough or congestion.

## 2013-09-09 NOTE — ED Provider Notes (Signed)
Medical screening examination/treatment/procedure(s) were performed by non-physician practitioner and as supervising physician I was immediately available for consultation/collaboration.  EKG Interpretation    Date/Time:  Sunday September 06 2013 22:23:01 EST Ventricular Rate:  80 PR Interval:  165 QRS Duration: 111 QT Interval:  386 QTC Calculation: 445 R Axis:   75 Text Interpretation:  Sinus rhythm ST elev, probable normal early repol pattern ED PHYSICIAN INTERPRETATION AVAILABLE IN [place] Gary Chavez              Hanley Seamen, MD 09/09/13 2242

## 2013-09-16 ENCOUNTER — Encounter: Payer: Self-pay | Admitting: Medical

## 2013-09-16 ENCOUNTER — Ambulatory Visit (INDEPENDENT_AMBULATORY_CARE_PROVIDER_SITE_OTHER): Payer: No Typology Code available for payment source | Admitting: Medical

## 2013-09-16 VITALS — BP 120/80 | HR 82 | Temp 98.4°F | Resp 16 | Wt 265.0 lb

## 2013-09-16 DIAGNOSIS — L738 Other specified follicular disorders: Secondary | ICD-10-CM

## 2013-09-16 DIAGNOSIS — L299 Pruritus, unspecified: Secondary | ICD-10-CM

## 2013-09-16 DIAGNOSIS — R079 Chest pain, unspecified: Secondary | ICD-10-CM

## 2013-09-16 DIAGNOSIS — R21 Rash and other nonspecific skin eruption: Secondary | ICD-10-CM

## 2013-09-16 DIAGNOSIS — L853 Xerosis cutis: Secondary | ICD-10-CM

## 2013-09-16 MED ORDER — TRIAMCINOLONE ACETONIDE 0.1 % EX CREA: 1.0000 "application " | TOPICAL_CREAM | Freq: Two times a day (BID) | CUTANEOUS | Status: DC

## 2013-09-16 MED ORDER — HYDROXYZINE HCL 10 MG PO TABS: 10.0000 mg | ORAL_TABLET | Freq: Three times a day (TID) | ORAL | Status: DC | PRN

## 2013-09-16 NOTE — Progress Notes (Signed)
Subjective:   Gary Chavez is a 22 y.o. male who presents as a new patient for evaluation of a rash involving the torso, arms, legs. Rash started 5 days ago. Lesions are barely noticeable to red, and raised in texture. Rash has improved over time. Rash is pruritic. Associated symptoms: none. Patient denies: abdominal pain, arthralgia, congestion, cough, decrease in energy level, fever, headache, irritability, myalgia and nausea. Patient has not had contacts with similar rash. Patient has not had new exposures.   He does not use lotion regularly. He does have a cat which has been itching more than usual. No other aggravating or relieving factors.  Of note he went to the emergency department recently for chest pain, and after evaluation was advised that his symptoms were related to anxiety.   He has had no further chest pain.  The following portions of the patient's history were reviewed and updated as appropriate: allergies, current medications, past family history, past medical history, past social history and problem list.  Review of Systems As in subjective above   Objective:   Gen: wd, wn, nad Skin: His skin is dry in general. Right lower back with 2 small slightly raised erythematous nonspecific lesions, a few other scattered 1-2 mm pain to red papules on right upper inner thigh and right medial elbow region. Otherwise no rash, skin unremarkable   Assessment:   Encounter Diagnoses  Name Primary?  . Rash and nonspecific skin eruption Yes  . Itching   . Dry skin   . Chest pain      Plan:   Discussed symptoms and exam findings.  Etiology most likely irritated skin, dry skin.  Begin hydroxyzine for itching for the next week or 2, triamcinolone cream topically when necessary, increase water intake, begin using daily moisturizing lotion. If worse or not improving in the next 2 weeks, then recheck. His chest pain has resolved. I reviewed his recent emergency department report.

## 2013-09-16 NOTE — Patient Instructions (Signed)
For dry and irritated skin  Begin Hydroxyzine tablet, 2-3 times daily, every 8 hours for rash and itching.  Take this for up to 2 weeks.   Begin Triamcinolone cream topically for itchy areas  Begin a daily moisturizing lotions such as Lubriderm, or Aquaphor, or Aveeno  Drink plenty of water daily  If not improving or worse in the next 2 weeks then call or recheck

## 2013-11-25 ENCOUNTER — Telehealth: Payer: Self-pay | Admitting: Hematology and Oncology

## 2013-11-25 NOTE — Telephone Encounter (Signed)
NEW PATIENT SCHEDULE 02/06 @ 1:30 W/DR. GORSUCH. REFERRING DR. Babette RelicAMMY Chavez DX- LEUKOCYTOSIS WELCOME PACKET MAILED.

## 2013-11-25 NOTE — Telephone Encounter (Signed)
C/D 11/25/13 for appt. 12/03/13

## 2013-12-03 ENCOUNTER — Telehealth: Payer: Self-pay | Admitting: Hematology and Oncology

## 2013-12-03 ENCOUNTER — Ambulatory Visit: Payer: No Typology Code available for payment source

## 2013-12-03 ENCOUNTER — Ambulatory Visit (HOSPITAL_BASED_OUTPATIENT_CLINIC_OR_DEPARTMENT_OTHER): Payer: No Typology Code available for payment source | Admitting: Hematology and Oncology

## 2013-12-03 ENCOUNTER — Encounter: Payer: Self-pay | Admitting: Hematology and Oncology

## 2013-12-03 ENCOUNTER — Ambulatory Visit (HOSPITAL_BASED_OUTPATIENT_CLINIC_OR_DEPARTMENT_OTHER): Payer: No Typology Code available for payment source

## 2013-12-03 VITALS — BP 149/88 | HR 105 | Temp 98.7°F | Resp 20 | Ht 74.0 in | Wt 277.6 lb

## 2013-12-03 DIAGNOSIS — D751 Secondary polycythemia: Secondary | ICD-10-CM

## 2013-12-03 DIAGNOSIS — D582 Other hemoglobinopathies: Secondary | ICD-10-CM

## 2013-12-03 DIAGNOSIS — D72829 Elevated white blood cell count, unspecified: Secondary | ICD-10-CM | POA: Insufficient documentation

## 2013-12-03 HISTORY — DX: Secondary polycythemia: D75.1

## 2013-12-03 LAB — CBC & DIFF AND RETIC
BASO%: 0.4 % (ref 0.0–2.0)
Basophils Absolute: 0 10*3/uL (ref 0.0–0.1)
EOS%: 1.3 % (ref 0.0–7.0)
Eosinophils Absolute: 0.1 10*3/uL (ref 0.0–0.5)
HEMATOCRIT: 46.4 % (ref 38.4–49.9)
HGB: 16.1 g/dL (ref 13.0–17.1)
IMMATURE RETIC FRACT: 1.9 % — AB (ref 3.00–10.60)
LYMPH%: 22.1 % (ref 14.0–49.0)
MCH: 29.7 pg (ref 27.2–33.4)
MCHC: 34.7 g/dL (ref 32.0–36.0)
MCV: 85.5 fL (ref 79.3–98.0)
MONO#: 0.3 10*3/uL (ref 0.1–0.9)
MONO%: 4.8 % (ref 0.0–14.0)
NEUT#: 5.1 10*3/uL (ref 1.5–6.5)
NEUT%: 71.4 % (ref 39.0–75.0)
Platelets: 237 10*3/uL (ref 140–400)
RBC: 5.43 10*6/uL (ref 4.20–5.82)
RDW: 12.4 % (ref 11.0–14.6)
Retic %: 1.12 % (ref 0.80–1.80)
Retic Ct Abs: 60.82 10*3/uL (ref 34.80–93.90)
WBC: 7.1 10*3/uL (ref 4.0–10.3)
lymph#: 1.6 10*3/uL (ref 0.9–3.3)

## 2013-12-03 LAB — CHCC SMEAR

## 2013-12-03 NOTE — Telephone Encounter (Signed)
gv and printed appt sched and avs for pt for Feb....sed sent ot lab

## 2013-12-03 NOTE — Progress Notes (Signed)
[facility] CONSULT NOTE  Patient Care Team: Jac Canavan, PA-C as PCP - General (Family Medicine)  CHIEF COMPLAINTS/PURPOSE OF CONSULTATION:  Leukocytosis and elevated hemoglobin  HISTORY OF PRESENTING ILLNESS:  Gary Chavez 23 y.o. male is here because of elevated WBC and abnormal hemoglobin.  He was found to have abnormal CBC from routine blood test CBC from 09/24/2013 show a white count of 12.2, hemoglobin of 16.1 and platelet count of 231 On 11/09/2013, CBC was rechecked which show white count 12.6, hemoglobin of 18.6 and platelet count 134 He denies recent infection. The last prescription antibiotics was more than 3 months ago There is not reported symptoms of sinus congestion, cough, urinary frequency/urgency or dysuria, diarrhea, joint swelling/pain or abnormal skin rash.  He had no prior history or diagnosis of cancer. His age appropriate screening programs are up-to-date. The patient has no prior diagnosis of autoimmune disease and was not prescribed corticosteroids related products. He denies any headache, chest pain all the cramps from the high hemoglobin   MEDICAL HISTORY:  Past Medical History  Diagnosis Date  . Seasonal allergies   . Anxiety   . Erythrocytosis 12/03/2013    SURGICAL HISTORY: History reviewed. No pertinent past surgical history.  SOCIAL HISTORY: History   Social History  . Marital Status: Single    Spouse Name: N/A    Number of Children: N/A  . Years of Education: 12+   Occupational History  . Student    Social History Main Topics  . Smoking status: Former Games developer  . Smokeless tobacco: Never Used  . Alcohol Use: 1.2 oz/week    1 Glasses of wine, 1 Cans of beer per week     Comment: occ  . Drug Use: No  . Sexual Activity: Not Currently   Other Topics Concern  . Not on file   Social History Narrative   Regular exercise-yes   Caffeine Use-yes    FAMILY HISTORY: Family History  Problem Relation Age of Onset   . Hypertension Mother   . Cancer Paternal Grandfather     colon cancer  . Diabetes Neg Hx   . Stroke Neg Hx     ALLERGIES:  is allergic to penicillins.  MEDICATIONS:  No current outpatient prescriptions on file.   No current facility-administered medications for this visit.    REVIEW OF SYSTEMS:   Constitutional: Denies fevers, chills or abnormal night sweats Eyes: Denies blurriness of vision, double vision or watery eyes Ears, nose, mouth, throat, and face: Denies mucositis or sore throat Respiratory: Denies cough, dyspnea or wheezes Cardiovascular: Denies palpitation, chest discomfort or lower extremity swelling Gastrointestinal:  Denies nausea, heartburn or change in bowel habits Skin: Denies abnormal skin rashes Lymphatics: Denies new lymphadenopathy or easy bruising Neurological:Denies numbness, tingling or new weaknesses Behavioral/Psych: Mood is stable, no new changes  All other systems were reviewed with the patient and are negative.  PHYSICAL EXAMINATION: ECOG PERFORMANCE STATUS: 0 - Asymptomatic  Filed Vitals:   12/03/13 1025  BP: 149/88  Pulse: 105  Temp: 98.7 F (37.1 C)  Resp: 20   Filed Weights   12/03/13 1025  Weight: 277 lb 9.6 oz (125.919 kg)    GENERAL:alert, no distress and comfortable SKIN: skin color, texture, turgor are normal, no rashes or significant lesions EYES: normal, conjunctiva are pink and non-injected, sclera clear OROPHARYNX:no exudate, no erythema and lips, buccal mucosa, and tongue normal  NECK: supple, thyroid normal size, non-tender, without nodularity LYMPH:  no palpable lymphadenopathy in the  cervical, axillary or inguinal LUNGS: clear to auscultation and percussion with normal breathing effort HEART: regular rate & rhythm and no murmurs and no lower extremity edema ABDOMEN:abdomen soft, non-tender and normal bowel sounds Musculoskeletal:no cyanosis of digits and no clubbing  PSYCH: alert & oriented x 3 with fluent  speech NEURO: no focal motor/sensory deficits  LABORATORY DATA:  I have reviewed the data as listed Recent Results (from the past 2160 hour(s))  POCT I-STAT TROPONIN I     Status: None   Collection Time    09/06/13 11:44 PM      Result Value Ref Range   Troponin i, poc 0.00  0.00 - 0.08 ng/mL   Comment 3            Comment: Due to the release kinetics of cTnI,     a negative result within the first hours     of the onset of symptoms does not rule out     myocardial infarction with certainty.     If myocardial infarction is still suspected,     repeat the test at appropriate intervals.  POCT I-STAT, CHEM 8     Status: Abnormal   Collection Time    09/06/13 11:47 PM      Result Value Ref Range   Sodium 139  135 - 145 mEq/L   Potassium 4.8  3.5 - 5.1 mEq/L   Chloride 98  96 - 112 mEq/L   BUN 24 (*) 6 - 23 mg/dL   Creatinine, Ser 7.821.50 (*) 0.50 - 1.35 mg/dL   Glucose, Bld 91  70 - 99 mg/dL   Calcium, Ion 9.561.20  1.12 - 1.23 mmol/L   TCO2 29  0 - 100 mmol/L   Hemoglobin 16.7  13.0 - 17.0 g/dL   HCT 21.349.0  08.639.0 - 57.852.0 %  [facility] SMEAR     Status: None   Collection Time    12/03/13 12:07 PM      Result Value Ref Range   Smear Result Smear Available    CBC & DIFF AND RETIC     Status: Abnormal   Collection Time    12/03/13 12:07 PM      Result Value Ref Range   WBC 7.1  4.0 - 10.3 10e3/uL   NEUT# 5.1  1.5 - 6.5 10e3/uL   HGB 16.1  13.0 - 17.1 g/dL   HCT 46.946.4  62.938.4 - 52.849.9 %   Platelets 237  140 - 400 10e3/uL   MCV 85.5  79.3 - 98.0 fL   MCH 29.7  27.2 - 33.4 pg   MCHC 34.7  32.0 - 36.0 g/dL   RBC 4.135.43  2.444.20 - 0.105.82 10e6/uL   RDW 12.4  11.0 - 14.6 %   lymph# 1.6  0.9 - 3.3 10e3/uL   MONO# 0.3  0.1 - 0.9 10e3/uL   Eosinophils Absolute 0.1  0.0 - 0.5 10e3/uL   Basophils Absolute 0.0  0.0 - 0.1 10e3/uL   NEUT% 71.4  39.0 - 75.0 %   LYMPH% 22.1  14.0 - 49.0 %   MONO% 4.8  0.0 - 14.0 %   EOS% 1.3  0.0 - 7.0 %   BASO% 0.4  0.0 - 2.0 %   Retic % 1.12  0.80 - 1.80 %   Retic Ct Abs  60.82  34.80 - 93.90 10e3/uL   Immature Retic Fract 1.90 (*) 3.00 - 10.60 %   I have ordered a stat CBC in my office that came  back normal  ASSESSMENT & PLAN #1 Leukocytosis, resolved #2 high hemoglobin, resolved I suspect his abnormal CBC is related to a lab error. The leukocytosis could be related to recent undiagnosed infection which has subsequently resolved I reassured the patient. I have not make return appointment for the patient to come back.

## 2013-12-03 NOTE — Progress Notes (Signed)
Checked in new patient with no financial issues. He has appt card. He said he sees dr. Leavy CellaBoyd as pcp.

## 2013-12-10 ENCOUNTER — Ambulatory Visit: Payer: No Typology Code available for payment source | Admitting: Hematology and Oncology

## 2013-12-14 ENCOUNTER — Encounter: Payer: Self-pay | Admitting: Family Medicine

## 2013-12-14 ENCOUNTER — Ambulatory Visit (INDEPENDENT_AMBULATORY_CARE_PROVIDER_SITE_OTHER): Payer: No Typology Code available for payment source | Admitting: Family Medicine

## 2013-12-14 VITALS — BP 130/80 | HR 80 | Temp 98.2°F | Ht 72.0 in | Wt 280.0 lb

## 2013-12-14 DIAGNOSIS — D751 Secondary polycythemia: Secondary | ICD-10-CM

## 2013-12-14 DIAGNOSIS — M25529 Pain in unspecified elbow: Secondary | ICD-10-CM

## 2013-12-14 DIAGNOSIS — F411 Generalized anxiety disorder: Secondary | ICD-10-CM

## 2013-12-14 NOTE — Patient Instructions (Signed)
The swelling in your arm was a dilated vein--no evidence of blood clot or other abnormality

## 2013-12-14 NOTE — Progress Notes (Signed)
Chief Complaint  Patient presents with  . Edema    woke up today and his right arm is swollen and his leg. Had a CPE with a doctor in [place]-hgb was 19 WBC was 12, was sent to cancer center. His numbers went back down to normal range-he is concerned about these swollen places. They are painful but sore.    Patient walked in today, to be evaluated for arm pain/swelling.  Muscles in his arms have been feeling sore, like after working out. He started having pain in the upper arms about 1.5-2 weeks ago, R>L. Today, when he woke up, the right arm looked swollen (antecubital fossa)--swelling was apparent in the area that he has been having the discomfort.  No known trauma. He reports that the swelling is significantly improved now, was more noticeable this morning.   Denies any change in activity, just walking--o lifting, injury/strain.Marland Kitchen.  He sometimes has pain in his left low back and hip.  A few weeks ago he noticed some linear "rashes" in his upper arms, which he attributed to allergies, and self-resolved.  He felt like the pain in his arms was related to the rash that was present.  Rash resolved, and muscle soreness started improving, but started to recur again.  Hasn't used any medications for the pain, never used heat, ice.  He tried a topical liquid medication for arthritis, which helped some with the pain.  He used this after the rash healed up (not prior).  Anxiety attacks are less frequent, once every 7 months. He tells me of the abnormal CBC on routine CPE, and that he had f/u with hematologist.  He has many questions re: the abnormalities, and what it could mean, what the pain in his arms could be from.  He admits to being very anxious, worrying a lot about these things, even though panic attacks are less frequent.  Past Medical History  Diagnosis Date  . Seasonal allergies   . Anxiety   . Erythrocytosis 12/03/2013   History reviewed. No pertinent past surgical history. History   Social  History  . Marital Status: Single    Spouse Name: N/A    Number of Children: N/A  . Years of Education: 12+   Occupational History  . Student    Social History Main Topics  . Smoking status: Never Smoker   . Smokeless tobacco: Never Used     Comment: occasionally smokes Hookah, socially  . Alcohol Use: 1.2 oz/week    1 Glasses of wine, 1 Cans of beer per week     Comment: 2 drinks per year  . Drug Use: No  . Sexual Activity: Not Currently   Other Topics Concern  . 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 Penicillins Rash   ROS:  Denies fevers, URI symptoms, present rashes, bleeding, bruising, chest pain, leg swelling.  +anxiety, intermittent low back pain, arm muscle pain.  No cough, shortness of breath.  PHYSICAL EXAM: BP 130/80  Pulse 80  Temp(Src) 98.2 F (36.8 C) (Oral)  Ht 6' (1.829 m)  Wt 280 lb (127.007 kg)  BMI 37.97 kg/m2 Very talkative, anxious male (somewhat difficult historian), in no distress UE's:  The area of concern for swelling that he had is actually a prominent vein (running horizontally just proximal to antecubital fossa, and  extending inferiorly at medial aspect).  There is no soft tissue swelling, no asymmetry in muscles or soft tissues.  He has 2+ pulses. Veins were nontender, no nodules. There was no edema of the extremity. LUE: there is some linear excoriation where he had scratched his skin in demonstrating where rash was during the history. Psych: +anxious.  Full range of affect. Normal eye contact Neuro: normal strength, sensation, gait  Lab Results  Component Value Date   WBC 7.1 12/03/2013   HGB 16.1 12/03/2013   HCT 46.4 12/03/2013   MCV 85.5 12/03/2013   PLT 237 12/03/2013   ASSESSMENT/PLAN:  Pain in joint, upper arm - myalgias resolved.  area of concern is prominent vein,  no evidence of phlebitis or clot.  reassured  Erythrocytosis - resolved.  reviewed lab results, and pt was reassured  Anxiety state, unspecified - generalized, with less frequent panic attacks.  Briefly discussed that if daily worries persist/worsen, to consider treatment.  A lot of reassurance provided

## 2013-12-23 ENCOUNTER — Other Ambulatory Visit: Payer: No Typology Code available for payment source | Admitting: Lab

## 2013-12-23 ENCOUNTER — Ambulatory Visit: Payer: No Typology Code available for payment source | Admitting: Hematology & Oncology

## 2013-12-23 ENCOUNTER — Ambulatory Visit: Payer: No Typology Code available for payment source

## 2014-01-07 ENCOUNTER — Ambulatory Visit: Payer: No Typology Code available for payment source | Admitting: Medical

## 2014-01-11 ENCOUNTER — Encounter: Payer: Self-pay | Admitting: Medical

## 2014-01-11 ENCOUNTER — Ambulatory Visit (INDEPENDENT_AMBULATORY_CARE_PROVIDER_SITE_OTHER): Payer: No Typology Code available for payment source | Admitting: Medical

## 2014-01-11 VITALS — BP 132/78 | HR 100 | Temp 98.8°F | Resp 16 | Wt 288.0 lb

## 2014-01-11 DIAGNOSIS — M7989 Other specified soft tissue disorders: Secondary | ICD-10-CM

## 2014-01-11 NOTE — Progress Notes (Signed)
Subjective: Here for recheck on right arm swelling.  Was seen here for same few weeks ago.  He notes ongoing right arm swelling.  He does note some recent visits with hematology and had several phlebotomy procedures for labs recently.  Smokes Hookie pipe.  He denies any other recent surgical procedure, no injury, no sudden pop, no pain lifting things with his arms.  He first noticed this out of the blue one day looking down at his arms. No other aggravating or relieving factors.  ROS as in subjective  Objective: General: Well-developed, well-nourished, no acute distress Skin: No obvious erythema, ecchymosis, warmth, induration, palpable cord MSK: There is a slight palpable fullness to his right anterior upper arm medial bicep region, what appears to be a palpable vein under the skin, but no obvious fluctuance, induration, cord No obvious generalized edema of the upper extremities Neuro: Unremarkable of upper extremity  Assessment: Encounter Diagnosis  Name Primary?  . Arm swelling Yes    Plan: I had supervising physician Dr. Susann GivensLalonde examine him as well.  We will set up for upper right arm ultrasound to further evaluate.

## 2014-01-13 ENCOUNTER — Telehealth: Payer: Self-pay | Admitting: Family Medicine

## 2014-01-13 NOTE — Telephone Encounter (Signed)
Patient is aware of his appointment with Dr. Ayesha MohairZack Smith [address] [place], [place] On 01/18/14 @ 200 pm for a U/S of right arm. CLS [phone]

## 2014-01-13 NOTE — Telephone Encounter (Signed)
Message copied by Janeice RobinsonSCALES, Angelyse Heslin L on Wed Jan 13, 2014  1:07 PM ------      Message from: Jac CanavanYSINGER, DAVID S      Created: Mon Jan 11, 2014  2:31 PM       Refer to sports medicine office of Dr. Ayesha MohairZack Smith per Dr. Susann GivensLalonde for ultrasound of right upper arm to evaluate fullness of the arm. ------

## 2014-01-14 ENCOUNTER — Ambulatory Visit: Payer: No Typology Code available for payment source | Admitting: Medical

## 2014-01-18 ENCOUNTER — Ambulatory Visit (INDEPENDENT_AMBULATORY_CARE_PROVIDER_SITE_OTHER): Payer: No Typology Code available for payment source | Admitting: Family Medicine

## 2014-01-18 ENCOUNTER — Ambulatory Visit: Payer: No Typology Code available for payment source | Admitting: Family Medicine

## 2014-01-18 ENCOUNTER — Other Ambulatory Visit (INDEPENDENT_AMBULATORY_CARE_PROVIDER_SITE_OTHER): Payer: No Typology Code available for payment source

## 2014-01-18 ENCOUNTER — Encounter: Payer: Self-pay | Admitting: Family Medicine

## 2014-01-18 VITALS — BP 140/98 | HR 123 | Wt 288.0 lb

## 2014-01-18 DIAGNOSIS — M79609 Pain in unspecified limb: Secondary | ICD-10-CM

## 2014-01-18 DIAGNOSIS — M79601 Pain in right arm: Secondary | ICD-10-CM

## 2014-01-18 DIAGNOSIS — M7989 Other specified soft tissue disorders: Secondary | ICD-10-CM | POA: Insufficient documentation

## 2014-01-18 NOTE — Patient Instructions (Signed)
Good to meet you I think you have a calcific lymph node that was infected but is gone.  Wear a compression sleeve on the arm with lifting Start gym again at 50% and increase 10 % a week.  Come back again if it swells so I can see it and we can scan it again.

## 2014-01-18 NOTE — Assessment & Plan Note (Signed)
Patient swelling has completely resolved at this time. On physical exam there is some mild calcific lymph node there was found on ultrasound. This is in the same area were patient states that the swelling was. Looking back at her patient's chart and when he had the lymphocytosis erythrocytosis at the description secondary to a infectious etiology that did cause swelling of the arm. Patient does have a positive contact with cats . Differential also includes lymphedema precox but no family history Patient is going to try compression with activity and see if the swelling of her comes back. Discussed icing after activity that could be beneficial. Patient in come back again on an as-needed basis or if any swelling occurs so we can look at it and see how that differs from his baseline which is today.

## 2014-01-18 NOTE — Progress Notes (Signed)
  Gary Chavez D.O. [facility] [address] [place], [place] [address] Phone: [phone] Subjective:    I'm seeing this patient by the request  of:  TYSINGER, DAVID Vincenza HewsSHANE, PA-C   CC: Right arm swelling  BJY:NWGNFAOZHYHPI:Subjective Harshith Thereasa Soloolastre is a 23 y.o. male coming in with complaint of right arm swelling. Patient states that he has had this for over a month now. Patient states that it initially happened this is at the same time when he was having an elevated white blood cell count. Patient states he kind of noticed it after maybe being scratched by his cat. Patient states over the course of time though this has decreased in size tremendously. Patient states that he has been icing it which does seem to be helpful. Patient states he never had any significant amount of pain. Patient does not remember any true injury. Overall patient thinks it is getting better but would like to know what it was. Denies any radiation down his arm or any numbness or tingling. Patient states that the swelling is most of the bicep muscle on the inside portion.     Past medical history, social, surgical and family history all reviewed in electronic medical record.   Review of Systems: No headache, visual changes, nausea, vomiting, diarrhea, constipation, dizziness, abdominal pain, skin rash, fevers, chills, night sweats, weight loss, swollen lymph nodes, body aches, joint swelling, muscle aches, chest pain, shortness of breath, mood changes.   Objective Blood pressure 140/98, pulse 123, weight 288 lb (130.636 kg), SpO2 97.00%.  General: No apparent distress alert and oriented x3 mood and affect normal, dressed appropriately.  HEENT: Pupils equal, extraocular movements intact  Respiratory: Patient's speak in full sentences and does not appear short of breath  Cardiovascular: No lower extremity edema, non tender, no erythema  Skin: Warm dry intact with no signs of infection or rash on extremities or on axial  skeleton.  Abdomen: Soft nontender  Neuro: Cranial nerves II through XII are intact, neurovascularly intact in all extremities with 2+ DTRs and 2+ pulses.  Lymph: No lymphadenopathy of posterior or anterior cervical chain or axillae bilaterally.  Gait normal with good balance and coordination.  MSK:  Non tender with full range of motion and good stability and symmetric strength and tone of shoulders, elbows, wrist, hip, knee and ankles bilaterally.  Right arm exam does not show any type of swelling. There is no asymmetry compared to the contralateral side. Patient has full range of motion of the shoulder with no impingement as well as full range of motion of the elbow did note discomfort. On palpation of patient's bicep there is what appears to be a very small nodule is palpated but nontender along the lymph node chain. This is freely mobile. It measures less than 1 cm. Neurovascularly intact distally with full strength  Limited musculoskeletal ultrasound was performed and interpreted by Antoine PrimasSMITH, ZACHARY, M Limited ultrasound patient's bicep muscle does not show any significant pathology. Patient though does have a neurovascular bundle with a lymph node this seems to be calcific in the area where patient was having pain and swelling he states. Mild increased upper flow in the area.  Impression: Calcific lymph node likely post infectious   Impression and Recommendations:     This case required medical decision making of moderate complexity.

## 2014-01-21 ENCOUNTER — Encounter: Payer: No Typology Code available for payment source | Admitting: Medical

## 2014-01-25 ENCOUNTER — Encounter: Payer: Self-pay | Admitting: Family Medicine

## 2014-02-05 ENCOUNTER — Telehealth: Payer: Self-pay | Admitting: Medical

## 2014-02-09 NOTE — Telephone Encounter (Signed)
I called patient and discussed the importance of keeping his appointments and giving us a 24 hour notice if he needs to cancel or reschedule.  He understands.

## 2014-03-02 ENCOUNTER — Encounter: Payer: Self-pay | Admitting: Medical

## 2014-03-02 ENCOUNTER — Ambulatory Visit (INDEPENDENT_AMBULATORY_CARE_PROVIDER_SITE_OTHER): Payer: No Typology Code available for payment source | Admitting: Medical

## 2014-03-02 VITALS — BP 118/80 | HR 82 | Temp 98.3°F | Resp 14 | Ht 71.1 in | Wt 281.0 lb

## 2014-03-02 DIAGNOSIS — F411 Generalized anxiety disorder: Secondary | ICD-10-CM

## 2014-03-02 DIAGNOSIS — Z0000 Encounter for general adult medical examination without abnormal findings: Secondary | ICD-10-CM

## 2014-03-02 DIAGNOSIS — R7989 Other specified abnormal findings of blood chemistry: Secondary | ICD-10-CM

## 2014-03-02 DIAGNOSIS — D751 Secondary polycythemia: Secondary | ICD-10-CM

## 2014-03-02 LAB — POCT URINALYSIS DIPSTICK
Bilirubin, UA: NEGATIVE
Blood, UA: NEGATIVE
Glucose, UA: NEGATIVE
KETONES UA: NEGATIVE
LEUKOCYTES UA: NEGATIVE
Nitrite, UA: NEGATIVE
PROTEIN UA: NEGATIVE
Spec Grav, UA: <=1.005
UROBILINOGEN UA: NEGATIVE
pH, UA: 5

## 2014-03-02 LAB — COMPREHENSIVE METABOLIC PANEL
ALT: 51 U/L (ref 0–53)
AST: 22 U/L (ref 0–37)
Albumin: 4.6 g/dL (ref 3.5–5.2)
Alkaline Phosphatase: 72 U/L (ref 39–117)
BUN: 11 mg/dL (ref 6–23)
CO2: 27 mEq/L (ref 19–32)
Calcium: 9.9 mg/dL (ref 8.4–10.5)
Chloride: 100 mEq/L (ref 96–112)
Creat: 1.21 mg/dL (ref 0.50–1.35)
Glucose, Bld: 87 mg/dL (ref 70–99)
Potassium: 4.1 mEq/L (ref 3.5–5.3)
SODIUM: 138 meq/L (ref 135–145)
TOTAL PROTEIN: 7.6 g/dL (ref 6.0–8.3)
Total Bilirubin: 0.9 mg/dL (ref 0.2–1.2)

## 2014-03-02 LAB — IRON AND TIBC
%SAT: 41 % (ref 20–55)
IRON: 145 ug/dL (ref 42–165)
TIBC: 355 ug/dL (ref 215–435)
UIBC: 210 ug/dL (ref 125–400)

## 2014-03-02 LAB — CBC WITH DIFFERENTIAL/PLATELET
BASOS ABS: 0 10*3/uL (ref 0.0–0.1)
Basophils Relative: 0 % (ref 0–1)
EOS ABS: 0.1 10*3/uL (ref 0.0–0.7)
EOS PCT: 2 % (ref 0–5)
HCT: 46.3 % (ref 39.0–52.0)
Hemoglobin: 16.2 g/dL (ref 13.0–17.0)
Lymphocytes Relative: 25 % (ref 12–46)
Lymphs Abs: 1.8 10*3/uL (ref 0.7–4.0)
MCH: 29.3 pg (ref 26.0–34.0)
MCHC: 35 g/dL (ref 30.0–36.0)
MCV: 83.9 fL (ref 78.0–100.0)
Monocytes Absolute: 0.4 10*3/uL (ref 0.1–1.0)
Monocytes Relative: 6 % (ref 3–12)
Neutro Abs: 4.8 10*3/uL (ref 1.7–7.7)
Neutrophils Relative %: 67 % (ref 43–77)
PLATELETS: 242 10*3/uL (ref 150–400)
RBC: 5.52 MIL/uL (ref 4.22–5.81)
RDW: 12.9 % (ref 11.5–15.5)
WBC: 7.2 10*3/uL (ref 4.0–10.5)

## 2014-03-02 LAB — LIPID PANEL
CHOL/HDL RATIO: 6.1 ratio
Cholesterol: 242 mg/dL — ABNORMAL HIGH (ref 0–200)
HDL: 40 mg/dL (ref >39)
LDL Cholesterol: 187 mg/dL — ABNORMAL HIGH (ref 0–99)
Triglycerides: 76 mg/dL (ref <150)
VLDL: 15 mg/dL (ref 0–40)

## 2014-03-02 LAB — FERRITIN: Ferritin: 313 ng/mL (ref 22–322)

## 2014-03-02 NOTE — Progress Notes (Signed)
Subjective:   HPI  Gary Chavez is a 23 y.o. male who presents for a complete physical.   Preventative care: Last ophthalmology visit:not really- eye exam last year-  Last dental visit:n/a Last colonoscopy:n/a Last prostate exam: n/a Last NWG:NFAOZHYQMVEKG:cardiology Last labs:?  Prior vaccinations: TD or Tdap:10/2002  TD-04/2013 Influenza:never Pneumococcal:never Shingles/Zostavax:never  Advanced directive:n/a Health care power of attorney:n/a Living will:n/a  Concerns: Has occasional right wrist pains  He reports issues with anxiety.  Lately feeling more anxious.  Has used Lorazepam in the past prn, but didn't react well to this.   Has seen counselor briefly in the past.  He limits caffeine.     Reviewed their medical, surgical, family, social, medication, and allergy history and updated chart as appropriate.  Past Medical History  Diagnosis Date  . Seasonal allergies   . Erythrocytosis 12/03/2013  . Anxiety     environmental allergies, hospitalized as a child due to rash and difficulty swallowing    History reviewed. No pertinent past surgical history.  History   Social History  . Marital Status: Single    Spouse Name: N/A    Number of Children: N/A  . Years of Education: 12+   Occupational History  . Student    Social History Main Topics  . Smoking status: Never Smoker   . Smokeless tobacco: Never Used     Comment: occasionally smokes Hookah, socially  . Alcohol Use: No  . Drug Use: No  . Sexual Activity: Not Currently   Other Topics Concern  . 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 Hypertension Mother   . Cancer Paternal Grandfather     colon  cancer  . Diabetes Neg Hx   . Stroke Neg Hx   . Heart disease Neg Hx   . Hyperlipidemia Neg Hx   . Colon polyps Father     Current outpatient prescriptions:mometasone (NASONEX) 50 MCG/ACT nasal spray, Place 2 sprays into the nose daily., Disp: , Rfl:   Allergies  Allergen Reactions  . Penicillins Rash       Review of Systems Constitutional: -fever, -chills, -sweats, -unexpected weight change, -decreased appetite, -fatigue Allergy: -sneezing, -itching, -congestion Dermatology: -changing moles, --rash, -lumps ENT: -runny nose, -ear pain, -sore throat, -hoarseness, -sinus pain, -teeth pain, - ringing in ears, -hearing loss, -nosebleeds Cardiology: -chest pain, -palpitations, -swelling, -difficulty breathing when lying flat, -waking up short of breath Respiratory: -cough, -shortness of breath, -difficulty breathing with exercise or exertion, -wheezing, -coughing up blood Gastroenterology: -abdominal pain, -nausea, -vomiting, -diarrhea, -constipation, -blood in stool, -changes in bowel movement, -difficulty swallowing or eating Hematology: -bleeding, -bruising  Musculoskeletal: -joint aches, -muscle aches, -joint swelling, -back pain, -neck pain, -cramping, -changes in gait Ophthalmology: denies vision changes, eye redness, itching, discharge Urology: -burning with urination, -difficulty urinating, -blood in urine, -urinary frequency, -urgency, -incontinence Neurology: -headache, -weakness, -tingling, -numbness, -memory loss, -falls, -dizziness Psychology: -depressed mood, -agitation, -sleep problems     Objective:   Physical Exam  BP 118/80  Pulse 82  Temp(Src) 98.3 F (36.8 C) (Oral)  Resp 14  Ht 5' 11.1" (1.806 m)  Wt 281 lb (127.461 kg)  BMI 39.08 kg/m2  General appearance: alert, no distress, WD/WN, male Skin: no worrisome lesions HEENT: normocephalic, conjunctiva/corneas normal, sclerae anicteric, PERRLA, EOMi, nares patent, no discharge or erythema, pharynx  normal Oral cavity: MMM, tongue normal, teeth normal Neck: supple, no lymphadenopathy, no thyromegaly, no masses, normal ROM, no bruits Chest: non tender, normal shape and expansion Heart: RRR, normal S1, S2, no murmurs Lungs: CTA bilaterally, no wheezes, rhonchi, or rales Abdomen: +bs, soft, non tender, non distended, no masses, no hepatomegaly, no splenomegaly, no bruits Back: non tender, normal ROM, no scoliosis Musculoskeletal: upper extremities non tender, no obvious deformity, normal ROM throughout, lower extremities non tender, no obvious deformity, normal ROM throughout Extremities: no edema, no cyanosis, no clubbing Pulses: 2+ symmetric, upper and lower extremities, normal cap refill Neurological: alert, oriented x 3, CN2-12 intact, strength normal upper extremities and lower extremities, sensation normal throughout, DTRs 2+ throughout, no cerebellar signs, gait normal Psychiatric: normal affect, behavior normal, pleasant  GU: normal male external genitalia, uncircumcised, nontender, no masses, no hernia, no lymphadenopathy Rectal: deferred   Assessment and Plan :    Encounter Diagnoses  Name Primary?  . Routine general medical examination at a health care facility Yes  . Other abnormal blood chemistry   . Erythrocytosis   . Anxiety state, unspecified     Physical exam - discussed healthy lifestyle, diet, exercise, preventative care, vaccinations, and addressed their concerns.   Labs today to recheck on prior abnormal labs regarding iron and RBCs Anxiety - he will consider counseling, accupuncture.  We discussed medications, but will use non medication approach.   Follow-up pending labs, 3-4 wk.

## 2014-06-24 ENCOUNTER — Other Ambulatory Visit: Payer: No Typology Code available for payment source

## 2014-06-24 DIAGNOSIS — Z23 Encounter for immunization: Secondary | ICD-10-CM

## 2014-06-24 DIAGNOSIS — Z139 Encounter for screening, unspecified: Secondary | ICD-10-CM

## 2014-06-25 LAB — MEASLES/MUMPS/RUBELLA IMMUNITY
Mumps IgG: >300 AU/mL — ABNORMAL HIGH (ref <9.00)
Rubella: 28.2 Index — ABNORMAL HIGH (ref <0.90)
Rubeola IgG: >300 AU/mL — ABNORMAL HIGH (ref <25.00)

## 2015-06-08 ENCOUNTER — Ambulatory Visit: Payer: No Typology Code available for payment source | Admitting: Medical

## 2015-06-08 ENCOUNTER — Encounter: Payer: Self-pay | Admitting: Family Medicine

## 2015-06-08 ENCOUNTER — Ambulatory Visit (INDEPENDENT_AMBULATORY_CARE_PROVIDER_SITE_OTHER): Payer: 59 | Admitting: Family Medicine

## 2015-06-08 VITALS — BP 132/92 | HR 100 | Temp 98.7°F | Ht 72.0 in | Wt 329.0 lb

## 2015-06-08 DIAGNOSIS — W57XXXA Bitten or stung by nonvenomous insect and other nonvenomous arthropods, initial encounter: Secondary | ICD-10-CM

## 2015-06-08 DIAGNOSIS — R238 Other skin changes: Secondary | ICD-10-CM

## 2015-06-08 DIAGNOSIS — S50811A Abrasion of right forearm, initial encounter: Secondary | ICD-10-CM

## 2015-06-08 DIAGNOSIS — L989 Disorder of the skin and subcutaneous tissue, unspecified: Secondary | ICD-10-CM

## 2015-06-08 DIAGNOSIS — S80862A Insect bite (nonvenomous), left lower leg, initial encounter: Secondary | ICD-10-CM | POA: Diagnosis not present

## 2015-06-08 NOTE — Patient Instructions (Signed)
  Use antibacterial ointment to the open skin lesions (ie Bacitracin or Neosporin) until the skin is healed, in order to prevent skin infection (right forearm, perineum)  Insect bite to left thigh--use a hydrocortisone cream up to three times daily if needed for itching.  I suspect this will gradually improve.  The area at the perineum looks like the skin is open--like a small split in the skin, or denuded area. There is no infection, or sign of any other process going on. Unsure of the exact cause--likely just irritation.  Since the skin is open, use an antibacterial ointment to prevent infection.  You can use a nonstick gauze for further protection, or you can also try a barrier cream such as Desitin (zinc oxide).  Return if any of these areas are increasing in size, redness, drainage, red streaks, fevers, or any other concerns, for re-evaluation.

## 2015-06-08 NOTE — Progress Notes (Signed)
Chief Complaint  Patient presents with  . Rash    has several places where he thinks he may have some bites that are leaking, somewhat itchy and not painful but bothersome, esp one on his lef leg. Went to [place] last week and once he got home these started. Thinks he could have sun poisoning or mosquito bites. Had brief dizziness right after insect bites.     He noticed a red itchy bump on his left thigh, as well as a red mark on his right forearm which is linear, not that itchy, and seems to be healing.  On his right peroneal area there is something also--slightly itchy, not painful. No drainage.  He noticed it when bathing about 3 days ago. It seems to be getting a little better/healing. He denies being in a sexual relationship recently. He was at the beach.  Different products than normal, staying in hotel  PMH, PSH, SH reviewed.  Outpatient Encounter Prescriptions as of 06/08/2015  Medication Sig  . ketoconazole (NIZORAL) 2 % cream Apply 1 application topically daily.  . [DISCONTINUED] mometasone (NASONEX) 50 MCG/ACT nasal spray Place 2 sprays into the nose daily.   No facility-administered encounter medications on file as of 06/08/2015.   Allergies  Allergen Reactions  . Penicillins Rash   ROS: no fever, chills, headache, URI symptoms, cough, shortness of breath, chest pain, GI complaints or other concerns, except as noted in HPI  PHYSICAL EXAM: BP 132/92 mmHg  Pulse 100  Temp(Src) 98.7 F (37.1 C) (Tympanic)  Ht 6' (1.829 m)  Wt 329 lb (149.233 kg)  BMI 44.61 kg/m2  Pleasant, obese male, mildly anxious/embarrassed about the lesion in perineum. He is in no distress, not scratching at skin.  Skin: 2-3 cm excoriation superficially on right forearm.  No active drainage, erythema. Dime sized area of mild erythema and induration at distal medial right thigh.  Papular, not vesicular, no excoriation, no pustule  Left perineal area, at skin fold there is 1-1.5 cm linear denuded  area. No surrounding erythema, induration; no crusting, swelling. nontender  ASSESSMENT/PLAN:  Insect bite of left leg, initial encounter  Excoriation of forearm, right, initial encounter  Skin irritation    Use antibacterial ointment to the open skin lesions (ie Bacitracin or Neosporin) until the skin is healed, in order to prevent skin infection.   Insect bite to left thigh--use a hydrocortisone cream three times daily if needed for itching.  I suspect this will gradually improve.   The area at the perineum looks like the skin is open--like a small split in the skin, or denuded area. There is no infection, or sign of any other process going on. Unsure of the exact cause--likely just irritation.  Since the skin is open, use an antibacterial ointment to prevent infection.  You can use a nonstick gauze for further protection, or you can also try a barrier cream such as Desitin (zinc oxide).  Return if any of these areas are increasing in size, redness, drainage, red streaks, fevers, or any other concerns, for re-evaluation.

## 2021-06-05 ENCOUNTER — Other Ambulatory Visit: Payer: Self-pay

## 2021-06-05 ENCOUNTER — Encounter (HOSPITAL_COMMUNITY): Payer: Self-pay

## 2021-06-05 ENCOUNTER — Emergency Department (HOSPITAL_COMMUNITY): Payer: Self-pay

## 2021-06-05 ENCOUNTER — Inpatient Hospital Stay (HOSPITAL_COMMUNITY)
Admission: EM | Admit: 2021-06-05 | Discharge: 2021-06-08 | DRG: 872 | Disposition: A | Discharge status: Home / Self Care | Payer: Self-pay | Attending: Internal Medicine | Admitting: Internal Medicine

## 2021-06-05 DIAGNOSIS — N5089 Other specified disorders of the male genital organs: Secondary | ICD-10-CM | POA: Diagnosis present

## 2021-06-05 DIAGNOSIS — A419 Sepsis, unspecified organism: Principal | ICD-10-CM | POA: Diagnosis present

## 2021-06-05 DIAGNOSIS — L0390 Cellulitis, unspecified: Secondary | ICD-10-CM | POA: Diagnosis present

## 2021-06-05 DIAGNOSIS — N492 Inflammatory disorders of scrotum: Secondary | ICD-10-CM | POA: Diagnosis present

## 2021-06-05 DIAGNOSIS — L03314 Cellulitis of groin: Secondary | ICD-10-CM

## 2021-06-05 DIAGNOSIS — Z20822 Contact with and (suspected) exposure to covid-19: Secondary | ICD-10-CM | POA: Diagnosis present

## 2021-06-05 LAB — COMPREHENSIVE METABOLIC PANEL
ALT: 33 U/L (ref 0–44)
AST: 23 U/L (ref 15–41)
Albumin: 4.3 g/dL (ref 3.5–5.0)
Alkaline Phosphatase: 73 U/L (ref 38–126)
Anion gap: 9 (ref 5–15)
BUN: 15 mg/dL (ref 6–20)
CO2: 24 mmol/L (ref 22–32)
Calcium: 9.4 mg/dL (ref 8.9–10.3)
Chloride: 106 mmol/L (ref 98–111)
Creatinine, Ser: 1.09 mg/dL (ref 0.61–1.24)
GFR, Estimated: >60 mL/min (ref >60)
Glucose, Bld: 102 mg/dL — ABNORMAL HIGH (ref 70–99)
Potassium: 3.7 mmol/L (ref 3.5–5.1)
Sodium: 139 mmol/L (ref 135–145)
Total Bilirubin: 0.5 mg/dL (ref 0.3–1.2)
Total Protein: 8.3 g/dL — ABNORMAL HIGH (ref 6.5–8.1)

## 2021-06-05 LAB — CBC WITH DIFFERENTIAL/PLATELET
Abs Immature Granulocytes: 0.29 10*3/uL — ABNORMAL HIGH (ref 0.00–0.07)
Basophils Absolute: 0 10*3/uL (ref 0.0–0.1)
Basophils Relative: 0 %
Eosinophils Absolute: 0.2 10*3/uL (ref 0.0–0.5)
Eosinophils Relative: 1 %
HCT: 51.6 % (ref 39.0–52.0)
Hemoglobin: 16.7 g/dL (ref 13.0–17.0)
Immature Granulocytes: 2 %
Lymphocytes Relative: 10 %
Lymphs Abs: 1.6 10*3/uL (ref 0.7–4.0)
MCH: 28.5 pg (ref 26.0–34.0)
MCHC: 32.4 g/dL (ref 30.0–36.0)
MCV: 88.1 fL (ref 80.0–100.0)
Monocytes Absolute: 0.9 10*3/uL (ref 0.1–1.0)
Monocytes Relative: 5 %
Neutro Abs: 13.5 10*3/uL — ABNORMAL HIGH (ref 1.7–7.7)
Neutrophils Relative %: 82 %
Platelets: 334 10*3/uL (ref 150–400)
RBC: 5.86 MIL/uL — ABNORMAL HIGH (ref 4.22–5.81)
RDW: 13.1 % (ref 11.5–15.5)
WBC: 16.6 10*3/uL — ABNORMAL HIGH (ref 4.0–10.5)
nRBC: 0 % (ref 0.0–0.2)

## 2021-06-05 LAB — URINALYSIS, ROUTINE W REFLEX MICROSCOPIC
Bacteria, UA: NONE SEEN
Bilirubin Urine: NEGATIVE
Glucose, UA: NEGATIVE mg/dL
Hgb urine dipstick: NEGATIVE
Ketones, ur: 5 mg/dL — AB
Leukocytes,Ua: NEGATIVE
Nitrite: NEGATIVE
Protein, ur: 30 mg/dL — AB
Specific Gravity, Urine: 1.03 (ref 1.005–1.030)
pH: 5 (ref 5.0–8.0)

## 2021-06-05 LAB — TSH: TSH: 1.264 u[IU]/mL (ref 0.350–4.500)

## 2021-06-05 LAB — LACTIC ACID, PLASMA: Lactic Acid, Venous: 1.6 mmol/L (ref 0.5–1.9)

## 2021-06-05 LAB — BRAIN NATRIURETIC PEPTIDE: B Natriuretic Peptide: 17.2 pg/mL (ref 0.0–100.0)

## 2021-06-05 MED ORDER — IOHEXOL 350 MG/ML SOLN
100.0000 mL | Freq: Once | INTRAVENOUS | Status: AC | PRN
  Administered 2021-06-05: 100 mL via INTRAVENOUS

## 2021-06-05 MED ORDER — KETOCONAZOLE 2 % EX CREA: 1.0000 "application " | TOPICAL_CREAM | Freq: Every day | CUTANEOUS | 0 refills | Status: AC

## 2021-06-05 MED ORDER — CEPHALEXIN 500 MG PO CAPS: ORAL_CAPSULE | ORAL | 0 refills | Status: DC

## 2021-06-05 MED ORDER — SODIUM CHLORIDE 0.9 % IV BOLUS
1000.0000 mL | Freq: Once | INTRAVENOUS | Status: AC
  Administered 2021-06-05: 1000 mL via INTRAVENOUS

## 2021-06-05 MED ORDER — VANCOMYCIN HCL 2000 MG/400ML IV SOLN
2000.0000 mg | Freq: Once | INTRAVENOUS | Status: AC
  Administered 2021-06-05: 2000 mg via INTRAVENOUS
  Filled 2021-06-05: qty 400

## 2021-06-05 MED ORDER — SODIUM CHLORIDE 0.9 % IV SOLN: 1.0000 g | Freq: Once | INTRAVENOUS | Status: DC

## 2021-06-05 NOTE — Progress Notes (Signed)
A consult was received from an ED physician for vanc per pharmacy dosing.  The patient's profile has been reviewed for ht/wt/allergies/indication/available labs.   A one time order has been placed for vanc 2g.  Further antibiotics/pharmacy consults should be ordered by admitting physician if indicated.                       Thank you, Berkley Harvey 06/05/2021  8:25 PM

## 2021-06-05 NOTE — ED Provider Notes (Signed)
Emergency Medicine Provider Triage Evaluation Note  Gary Chavez , a 30 y.o. male  was evaluated in triage.  Pt complains of pruritus to scrotal region associated with edema x3 days.  Patient states he has been using a oil which caused edema.  Denies testicular pain.  He has also been using hydrocortisone cream with no relief and pruritus.  No history of herpes.  Patient was last sexually active 3 years ago.  No concern for STDs.  No penile discharge.  Denies dysuria.  Denies abdominal pain, fever, chills.  Review of Systems  Positive: Scrotal edema Negative:   Physical Exam  BP (!) 172/111   Pulse (!) 133   Temp 98.2 F (36.8 C)   Resp 20   Ht 6' (1.829 m)   Wt (!) 149 kg   SpO2 97%   BMI 44.55 kg/m  Gen:   Awake, no distress   Resp:  Normal effort  MSK:   Moves extremities without difficulty  Other:    Medical Decision Making  Medically screening exam initiated at 3:26 PM.  Appropriate orders placed.  Gary Chavez was informed that the remainder of the evaluation will be completed by another provider, this initial triage assessment does not replace that evaluation, and the importance of remaining in the ED until their evaluation is complete.  [place] ordered   Jesusita Oka 06/05/21 1527    Derwood Kaplan, MD 06/06/21 1105

## 2021-06-05 NOTE — ED Triage Notes (Signed)
Pt reports itching to genital area x1 month. Pt reports ordering oil for dry skin offline and using it for 3 days and itching increased. Pt reports improvement with hydrocortisone cream. Scrotum swelling x2 days.

## 2021-06-05 NOTE — ED Provider Notes (Signed)
[facility]-EMERGENCY DEPT Provider Note   CSN: [id number] Arrival date & time: 06/05/21  1508     History Chief Complaint  Patient presents with   Pruritis    Gary Chavez is a 30 y.o. male who presents emergency department with chief complaint of scrotal itching and swelling.  Patient reports that several days ago he began having some redness and itching on his scrotum.  He tried treating it with essential oils however the redness and itching spread across his scrotum.  He noticed 3 days ago that he began having swelling and edema in the scrotum which progressed significantly.  He tried hydrocortisone cream on his scrotum.  Yesterday morning he noticed that the swelling became worse and involve the skin around the shaft of his penis.  He has been able to urinate.  He denies any urinary symptoms, fever, chills. He denies scrotal pain, sob, peripheral edema or injuries. He has no risk factors for STI.  HPI     Past Medical History:  Diagnosis Date   Anxiety    environmental allergies, hospitalized as a child due to rash and difficulty swallowing   Erythrocytosis 12/03/2013   Seasonal allergies     Patient Active Problem List   Diagnosis Date Noted   Sepsis (HCC) 06/06/2021   Cellulitis of scrotum 06/06/2021   Cellulitis 06/05/2021   Arm swelling 01/18/2014   Leukocytosis, unspecified 12/03/2013   Erythrocytosis 12/03/2013   Obesity (BMI 30-39.9) 04/30/2013   GERD (gastroesophageal reflux disease) 04/30/2013   Seasonal allergies     History reviewed. No pertinent surgical history.     Family History  Problem Relation Age of Onset   Hypertension Mother    Cancer Paternal Grandfather        colon cancer   Diabetes Neg Hx    Stroke Neg Hx    Heart disease Neg Hx    Hyperlipidemia Neg Hx    Colon polyps Father     Social History   Tobacco Use   Smoking status: Never   Smokeless tobacco: Never   Tobacco comments:    occasionally smokes  Hookah, socially  Substance Use Topics   Alcohol use: No   Drug use: No    Home Medications Prior to Admission medications   Medication Sig Start Date End Date Taking? Authorizing Provider  cephALEXin (KEFLEX) 500 MG capsule 2 caps po bid x 7 days 06/05/21  Yes Haniya Fern, PA-C  diphenhydrAMINE (BENADRYL) 25 MG tablet Take 50 mg by mouth every 6 (six) hours as needed for itching.   Yes [provider]  ketoconazole (NIZORAL) 2 % cream Apply 1 application topically daily. 06/05/21  Yes Arthor CaptainHarris, Eryc Bodey, PA-C    Allergies    Penicillins  Review of Systems   Review of Systems Ten systems reviewed and are negative for acute change, except as noted in the HPI.   Physical Exam Updated Vital Signs BP 130/82 (BP Location: Right Arm)   Pulse 98   Temp 98.4 F (36.9 C) (Oral)   Resp 20   Ht 6' (1.829 m)   Wt (!) 149 kg   SpO2 99%   BMI 44.55 kg/m   Physical Exam Vitals and nursing note reviewed.  Constitutional:      General: He is not in acute distress.    Appearance: He is well-developed. He is not diaphoretic.  HENT:     Head: Normocephalic and atraumatic.  Eyes:     General: No scleral icterus.  Conjunctiva/sclera: Conjunctivae normal.  Cardiovascular:     Rate and Rhythm: Normal rate and regular rhythm.     Heart sounds: Normal heart sounds.  Pulmonary:     Effort: Pulmonary effort is normal. No respiratory distress.     Breath sounds: Normal breath sounds.  Abdominal:     Palpations: Abdomen is soft.     Tenderness: There is no abdominal tenderness.  Genitourinary:    Comments: Marked scrotal and penile edema. The skin of the thighs, scrotum is indurated, but not tender. There tissue is macerated. No penile discharge. Musculoskeletal:     Cervical back: Normal range of motion and neck supple.  Skin:    General: Skin is warm and dry.  Neurological:     Mental Status: He is alert.  Psychiatric:        Behavior: Behavior normal.    ED Results /  Procedures / Treatments   Labs (all labs ordered are listed, but only abnormal results are displayed) Labs Reviewed  URINALYSIS, ROUTINE W REFLEX MICROSCOPIC - Abnormal; Notable for the following components:      Result Value   Ketones, ur 5 (*)    Protein, ur 30 (*)    All other components within normal limits  COMPREHENSIVE METABOLIC PANEL - Abnormal; Notable for the following components:   Glucose, Bld 102 (*)    Total Protein 8.3 (*)    All other components within normal limits  CBC WITH DIFFERENTIAL/PLATELET - Abnormal; Notable for the following components:   WBC 16.6 (*)    RBC 5.86 (*)    Neutro Abs 13.5 (*)    Abs Immature Granulocytes 0.29 (*)    All other components within normal limits  CBC WITH DIFFERENTIAL/PLATELET - Abnormal; Notable for the following components:   WBC 16.9 (*)    Neutro Abs 13.9 (*)    All other components within normal limits  COMPREHENSIVE METABOLIC PANEL - Abnormal; Notable for the following components:   CO2 21 (*)    Glucose, Bld 109 (*)    All other components within normal limits  CULTURE, BLOOD (SINGLE)  RESP PANEL BY RT-PCR (FLU A&B, COVID) ARPGX2  LACTIC ACID, PLASMA  BRAIN NATRIURETIC PEPTIDE  TSH  HIV ANTIBODY (ROUTINE TESTING W REFLEX)  LACTIC ACID, PLASMA  PROCALCITONIN  TSH  GC/CHLAMYDIA PROBE AMP ([place]) NOT AT [facility]    EKG EKG Interpretation  Date/Time:  Monday June 05 2021 17:42:31 EDT Ventricular Rate:  118 PR Interval:  152 QRS Duration: 102 QT Interval:  327 QTC Calculation: 459 R Axis:   52 Text Interpretation: Sinus tachycardia Probable left atrial enlargement 12 Lead; Mason-Likar No significant change since last tracing Confirmed by Meridee Score [phone]) on 06/06/2021 2:56:30 PM  Radiology DG Chest 2 View  Result Date: 06/05/2021 CLINICAL DATA:  Scrotal edema/pruritis EXAM: CHEST - 2 VIEW COMPARISON:  09/06/2013 FINDINGS: Lungs are clear.  No pleural effusion or pneumothorax. The heart is normal in  size. Visualized osseous structures are within normal limits. IMPRESSION: Normal chest radiographs. Electronically Signed   By: Charline Bills M.D.   On: 06/05/2021 19:25   CT Angio Chest PE W and/or Wo Contrast  Result Date: 06/05/2021 CLINICAL DATA:  PE suspected, high prob EXAM: CT ANGIOGRAPHY CHEST WITH CONTRAST TECHNIQUE: Multidetector CT imaging of the chest was performed using the standard protocol during bolus administration of intravenous contrast. Multiplanar CT image reconstructions and MIPs were obtained to evaluate the vascular anatomy. CONTRAST:  OMNIPAQUE IOHEXOL 350 MG/ML SOLN  COMPARISON:  Radiograph earlier today FINDINGS: Cardiovascular: Technically suboptimal evaluation for pulmonary embolus given contrast bolus and soft tissue attenuation from habitus. Allowing for this, no pulmonary emboli are seen to the lobar level. Normal caliber thoracic aorta. Heart is normal in size. There is no pericardial effusion. Mediastinum/Nodes: No enlarged mediastinal or hilar lymph nodes. No esophageal wall thickening. No thyroid nodule. Lungs/Pleura: Known for mild motion artifact limitation, the lungs are clear. There is no focal airspace disease. No pleural fluid. No findings of pulmonary edema. No pulmonary mass or nodule. Trachea and central bronchi are patent. Upper Abdomen: Assessed on concurrent abdominopelvic CT, reported separately. Musculoskeletal: There are no acute or suspicious osseous abnormalities. Review of the MIP images confirms the above findings. IMPRESSION: 1. Technically suboptimal evaluation for pulmonary embolus given contrast bolus and soft tissue attenuation from habitus. Allowing for this, no pulmonary emboli are seen to the lobar level. 2. No acute intrathoracic abnormality. Electronically Signed   By: Narda Rutherford M.D.   On: 06/05/2021 21:08   CT ABDOMEN PELVIS W CONTRAST  Result Date: 06/05/2021 CLINICAL DATA:  Provided history of: Scrotal mass or lump (Ped  0-18y) Patient reports scrotal pruritus and edema. EXAM: CT ABDOMEN AND PELVIS WITH CONTRAST TECHNIQUE: Multidetector CT imaging of the abdomen and pelvis was performed using the standard protocol following bolus administration of intravenous contrast. CONTRAST:  OMNIPAQUE IOHEXOL 350 MG/ML SOLN COMPARISON:  Scrotal ultrasound earlier today FINDINGS: Lower chest: Assessed on concurrent chest CTA, reported separately. Hepatobiliary: Prominent liver spanning 20.9 cm cranial caudal with diffuse steatosis. No discrete focal hepatic lesion. Decompressed gallbladder. No calcified gallstone. No biliary dilatation. Pancreas: No ductal dilatation or inflammation. Spleen: Normal in size without focal abnormality. Adrenals/Urinary Tract: Normal adrenal glands. No hydronephrosis or perinephric edema. Homogeneous renal enhancement. No visualized renal calculi or focal lesion. Urinary bladder is physiologically distended without wall thickening. Stomach/Bowel: Stomach is within normal limits. Appendix appears normal. No evidence of bowel wall thickening, distention, or inflammatory changes. Vascular/Lymphatic: Normal caliber abdominal aorta. No portal venous or mesenteric gas. Multiple small retroperitoneal lymph nodes are likely reactive. Mildly enlarged right greater than left inguinal nodes, 12-16 mm short axis, also likely reactive. Reproductive: There is diffuse and marked scrotal skin thickening. No hydrocele is seen on scrotal ultrasound earlier today. There is no evidence of focal fluid collection. No soft tissue air. Unremarkable prostate. Other: No free air or ascites. Tiny fat containing umbilical hernia. There is no inguinal hernia. Musculoskeletal: Hemi transitional lumbosacral anatomy with enlarged left transverse process at the transitional lumbosacral vertebra. There are no acute or suspicious osseous abnormalities. IMPRESSION: 1. Diffuse and marked scrotal skin thickening, consistent with cellulitis. No  hydrocele was seen on scrotal ultrasound earlier today. No abscess or soft tissue air. No extra scrotal inflammation or extension of the perineum. 2. Hepatomegaly and hepatic steatosis. Electronically Signed   By: Narda Rutherford M.D.   On: 06/05/2021 21:05   US SCROTUM W/DOPPLER  Result Date: 06/05/2021 CLINICAL DATA:  Edema. Patient states he put topical ointment/cream for itching, now presenting with swelling. EXAM: SCROTAL ULTRASOUND DOPPLER ULTRASOUND OF THE TESTICLES TECHNIQUE: Complete ultrasound examination of the testicles, epididymis, and other scrotal structures was performed. Color and spectral Doppler ultrasound were also utilized to evaluate blood flow to the testicles. COMPARISON:  None. FINDINGS: Right testicle Measurements: 4.9 x 2.2 x 2.3 cm. No mass. Microlithiasis visualized. Left testicle Measurements: 4.8 x 2.9 x 2.6 cm. No mass. Microlithiasis visualized. Right epididymis:  Normal in size and appearance.  Left epididymis:  Normal in size and appearance. Hydrocele:  None visualized. Varicocele:  None visualized. Pulsed Doppler interrogation of both testes demonstrates normal low resistance arterial and venous waveforms bilaterally. Other: Subcutaneus soft tissue edema of bilateral scrotum. No definite emphysema identified. IMPRESSION: 1. Subcutaneus soft tissue edema of bilateral scrotum. No definite emphysema identified. Please correlate clinically as a necrotizing fasciitis cannot be excluded. 2. 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 Monthly testicular self-examination and annual physical exams are considered appropriate surveillance. If patient has other risk factors for testicular carcinoma, then referral to Urology should be considered. (Reference: DeCastro, et al.: A 5-Year Follow up Study of  Asymptomatic Men with Testicular Microlithiasis. J Urol 2008; 179:1420-1423.) 3. Otherwise unremarkable ultrasound of the testicles. Electronically Signed   By: Tish Frederickson M.D.   On: 06/05/2021 16:50    Procedures .Critical Care  Date/Time: 06/06/2021 3:38 PM Performed by: Arthor Captain, PA-C Authorized by: Arthor Captain, PA-C   Critical care provider statement:    Critical care time (minutes):  60   Critical care time was exclusive of:  Separately billable procedures and treating other patients   Critical care was necessary to treat or prevent imminent or life-threatening deterioration of the following conditions:  Sepsis   Critical care was time spent personally by me on the following activities:  Discussions with consultants, evaluation of patient's response to treatment, examination of patient, ordering and performing treatments and interventions, ordering and review of laboratory studies, ordering and review of radiographic studies, pulse oximetry, re-evaluation of patient's condition, obtaining history from patient or surrogate and review of old charts   Medications Ordered in ED Medications  heparin injection 5,000 Units (5,000 Units Subcutaneous Given 06/06/21 0652)  lactated ringers infusion ( Intravenous Stopped 06/06/21 1152)  acetaminophen (TYLENOL) tablet 650 mg (has no administration in time range)    Or  acetaminophen (TYLENOL) suppository 650 mg (has no administration in time range)  cefTRIAXone (ROCEPHIN) 2 g in sodium chloride 0.9 % 100 mL IVPB (0 g Intravenous Stopped 06/06/21 0258)  vancomycin (VANCOREADY) IVPB 1750 mg/350 mL (0 mg Intravenous Stopped 06/06/21 1152)  diphenhydrAMINE (BENADRYL) injection 25 mg (has no administration in time range)  sodium chloride 0.9 % bolus 1,000 mL (0 mLs Intravenous Stopped 06/05/21 2353)  vancomycin (VANCOREADY) IVPB 2000 mg/400 mL (0 mg Intravenous Stopped 06/05/21 2353)  iohexol (OMNIPAQUE) 350 MG/ML injection 100 mL (100 mLs  Intravenous Contrast Given 06/05/21 2042)    ED Course  I have reviewed the triage vital signs and the nursing notes.  Pertinent labs & imaging results that were available during my care of the patient were reviewed by me and considered in my medical decision making (see chart for details).  Clinical Course as of 06/06/21 1537  Mon Jun 05, 2021  1800 Patient with persistent tachycardia- unexplained. Will proceed with further work up. Low suspicion for sepsis- He has no pain and I have low suspicion for cellulitis.  [AH]    Clinical Course User Index [AH] Arthor Captain, PA-C   MDM Rules/Calculators/A&P                            Patient here with Scrotal swelling. Intitially The clinical exam appeared rather benign as the patient reports little to know pain and I felt his skin  examination most likely due to mycotic infection. Patient however had persistent tachycardia and work up expanded to include potential dx of cellulitis w/sepsis, abdominal mass, -less likely fournier's gangrene, renal/hepatic dysfunction. PE, Hyperthyroid or CHF.  I ordered and reviewed labs that inclued CBC- w/ Leukocytosis CMP. Mildly elevated glucose of insignificant values LA wnl Urine w/o evidence of infection  BNP, WNL  EKG shows sinus tachycardia at a rate of 118  I ordered and reviewed imaging including 2 v cxr, scrotal US. CTA chest and CT abd/pel- No acute findings except for scrotal wall thickening and reactive lymph nodes suggestive of cellulitis.  Given the patient's clinical picture, persistent tachycardia and have to think of potential sepsis with source.  Patient giving getting fluids, he is also receiving IV vancomycin.  I discussed all findings with the patient and feel he will need admission.  Patient is in agreement. Final Clinical Impression(s) / ED Diagnoses Final diagnoses:  Scrotal swelling  Cellulitis of groin    Rx / DC Orders ED Discharge Orders          Ordered     ketoconazole (NIZORAL) 2 % cream  Daily        06/05/21 1712    cephALEXin (KEFLEX) 500 MG capsule        06/05/21 1712             Arthor Captain, PA-C 06/06/21 1539    Franne Forts, DO 06/08/21 1628

## 2021-06-06 ENCOUNTER — Encounter (HOSPITAL_COMMUNITY): Payer: Self-pay | Admitting: Internal Medicine

## 2021-06-06 DIAGNOSIS — A419 Sepsis, unspecified organism: Principal | ICD-10-CM

## 2021-06-06 DIAGNOSIS — N492 Inflammatory disorders of scrotum: Secondary | ICD-10-CM

## 2021-06-06 DIAGNOSIS — N5089 Other specified disorders of the male genital organs: Secondary | ICD-10-CM

## 2021-06-06 LAB — COMPREHENSIVE METABOLIC PANEL
ALT: 27 U/L (ref 0–44)
AST: 20 U/L (ref 15–41)
Albumin: 3.6 g/dL (ref 3.5–5.0)
Alkaline Phosphatase: 61 U/L (ref 38–126)
Anion gap: 11 (ref 5–15)
BUN: 14 mg/dL (ref 6–20)
CO2: 21 mmol/L — ABNORMAL LOW (ref 22–32)
Calcium: 9.1 mg/dL (ref 8.9–10.3)
Chloride: 106 mmol/L (ref 98–111)
Creatinine, Ser: 1.03 mg/dL (ref 0.61–1.24)
GFR, Estimated: >60 mL/min (ref >60)
Glucose, Bld: 109 mg/dL — ABNORMAL HIGH (ref 70–99)
Potassium: 3.6 mmol/L (ref 3.5–5.1)
Sodium: 138 mmol/L (ref 135–145)
Total Bilirubin: 0.7 mg/dL (ref 0.3–1.2)
Total Protein: 7.2 g/dL (ref 6.5–8.1)

## 2021-06-06 LAB — TSH: TSH: 1.491 u[IU]/mL (ref 0.350–4.500)

## 2021-06-06 LAB — RESP PANEL BY RT-PCR (FLU A&B, COVID) ARPGX2
Influenza A by PCR: NEGATIVE
Influenza B by PCR: NEGATIVE
SARS Coronavirus 2 by RT PCR: NEGATIVE

## 2021-06-06 LAB — CBC WITH DIFFERENTIAL/PLATELET
Abs Immature Granulocytes: 0.06 10*3/uL (ref 0.00–0.07)
Basophils Absolute: 0 10*3/uL (ref 0.0–0.1)
Basophils Relative: 0 %
Eosinophils Absolute: 0.2 10*3/uL (ref 0.0–0.5)
Eosinophils Relative: 1 %
HCT: 47.5 % (ref 39.0–52.0)
Hemoglobin: 15.5 g/dL (ref 13.0–17.0)
Immature Granulocytes: 0 %
Lymphocytes Relative: 10 %
Lymphs Abs: 1.7 10*3/uL (ref 0.7–4.0)
MCH: 28.7 pg (ref 26.0–34.0)
MCHC: 32.6 g/dL (ref 30.0–36.0)
MCV: 87.8 fL (ref 80.0–100.0)
Monocytes Absolute: 1 10*3/uL (ref 0.1–1.0)
Monocytes Relative: 6 %
Neutro Abs: 13.9 10*3/uL — ABNORMAL HIGH (ref 1.7–7.7)
Neutrophils Relative %: 83 %
Platelets: 261 10*3/uL (ref 150–400)
RBC: 5.41 MIL/uL (ref 4.22–5.81)
RDW: 13.4 % (ref 11.5–15.5)
WBC: 16.9 10*3/uL — ABNORMAL HIGH (ref 4.0–10.5)
nRBC: 0 % (ref 0.0–0.2)

## 2021-06-06 LAB — GC/CHLAMYDIA PROBE AMP (~~LOC~~) NOT AT ARMC
Chlamydia: NEGATIVE
Comment: NEGATIVE
Comment: NORMAL
Neisseria Gonorrhea: NEGATIVE

## 2021-06-06 LAB — HIV ANTIBODY (ROUTINE TESTING W REFLEX): HIV Screen 4th Generation wRfx: NONREACTIVE

## 2021-06-06 LAB — LACTIC ACID, PLASMA: Lactic Acid, Venous: 1.1 mmol/L (ref 0.5–1.9)

## 2021-06-06 LAB — PROCALCITONIN: Procalcitonin: <0.1 ng/mL

## 2021-06-06 MED ORDER — VANCOMYCIN HCL 1750 MG/350ML IV SOLN
1750.0000 mg | Freq: Two times a day (BID) | INTRAVENOUS | Status: DC
  Administered 2021-06-06 – 2021-06-08 (×5): 1750 mg via INTRAVENOUS
  Filled 2021-06-06 (×6): qty 350

## 2021-06-06 MED ORDER — HYDROCORTISONE 1 % EX OINT
TOPICAL_OINTMENT | Freq: Once | CUTANEOUS | Status: AC
  Filled 2021-06-06 (×2): qty 28.35

## 2021-06-06 MED ORDER — HEPARIN SODIUM (PORCINE) 5000 UNIT/ML IJ SOLN
5000.0000 [IU] | Freq: Three times a day (TID) | INTRAMUSCULAR | Status: DC
  Administered 2021-06-06 – 2021-06-07 (×6): 5000 [IU] via SUBCUTANEOUS
  Filled 2021-06-06 (×6): qty 1

## 2021-06-06 MED ORDER — ACETAMINOPHEN 325 MG PO TABS
650.0000 mg | ORAL_TABLET | Freq: Four times a day (QID) | ORAL | Status: DC | PRN
  Filled 2021-06-06: qty 2

## 2021-06-06 MED ORDER — ACETAMINOPHEN 650 MG RE SUPP: 650.0000 mg | Freq: Four times a day (QID) | RECTAL | Status: DC | PRN

## 2021-06-06 MED ORDER — LACTATED RINGERS IV SOLN: INTRAVENOUS | Status: AC

## 2021-06-06 MED ORDER — SODIUM CHLORIDE 0.9 % IV SOLN
2.0000 g | INTRAVENOUS | Status: DC
  Administered 2021-06-06 – 2021-06-08 (×3): 2 g via INTRAVENOUS
  Filled 2021-06-06 (×2): qty 2
  Filled 2021-06-06: qty 20

## 2021-06-06 MED ORDER — DIPHENHYDRAMINE HCL 50 MG/ML IJ SOLN
25.0000 mg | Freq: Four times a day (QID) | INTRAMUSCULAR | Status: DC | PRN
  Administered 2021-06-06 – 2021-06-07 (×2): 25 mg via INTRAVENOUS
  Filled 2021-06-06 (×2): qty 1

## 2021-06-06 NOTE — Progress Notes (Signed)
Pt wanted his mother to stay the night with him, since it was 2145 and she was still here - given to OK to stay tonight.

## 2021-06-06 NOTE — H&P (Signed)
History and Physical    Gary Chavez Buelow ZOX:[id number] DOB: July 20, 1991 DOA: 06/05/2021  PCP: Pcp, No  Patient coming from: Home.  Chief Complaint: Scrotal swelling.  HPI: Gary Chavez Mariner is a 30 y.o. male with no significant past medical history presents to the ER with complaints of having scrotal swelling and itching for the last 2 days.  Denies any trauma or insect bite.  Denies any penile discharge.  Patient has noticed mild pain around the scrotal skin.  Denies any fever chills.  She took some Benadryl for itching.  ED Course: In the ER patient was persistently tachycardic with blood work showing leukocytosis of 16,000 lactic acid was normal.  Patient had CT chest abdomen pelvis which shows features consistent with scrotal cellulitis.  No pulmonary embolism.  Blood cultures were drawn and patient was started on empiric antibiotics.  Dopplers of the scrotum was done which shows skin thickening and testicular microlithiasis.  COVID test was negative.  Review of Systems: As per HPI, rest all negative.   Past Medical History:  Diagnosis Date   Anxiety    environmental allergies, hospitalized as a child due to rash and difficulty swallowing   Erythrocytosis 12/03/2013   Seasonal allergies     History reviewed. No pertinent surgical history.   reports that he has never smoked. He has never used smokeless tobacco. He reports that he does not drink alcohol and does not use drugs.  Allergies  Allergen Reactions   Penicillins Rash    Family History  Problem Relation Age of Onset   Hypertension Mother    Cancer Paternal Grandfather        colon cancer   Diabetes Neg Hx    Stroke Neg Hx    Heart disease Neg Hx    Hyperlipidemia Neg Hx    Colon polyps Father     Prior to Admission medications   Medication Sig Start Date End Date Taking? Authorizing Provider  cephALEXin (KEFLEX) 500 MG capsule 2 caps po bid x 7 days 06/05/21  Yes Harris, Abigail, PA-C  diphenhydrAMINE (BENADRYL)  25 MG tablet Take 50 mg by mouth every 6 (six) hours as needed for itching.   Yes [provider]  ketoconazole (NIZORAL) 2 % cream Apply 1 application topically daily. 06/05/21  Yes Arthor CaptainHarris, Abigail, PA-C    Physical Exam: Constitutional: Moderately built and nourished. Vitals:   06/05/21 2230 06/05/21 2330 06/06/21 0000 06/06/21 0100  BP: (!) 180/133 102/65 (!) 136/93 119/84  Pulse: (!) 117 (!) 111 (!) 106 (!) 110  Resp: 17 20 (!) 30 (!) 33  Temp:      SpO2: 98% 98% 97% 98%  Weight:      Height:       Eyes: Anicteric no pallor. ENMT: No discharge from the ears eyes nose and mouth. Neck: No mass felt.  No neck rigidity. Respiratory: No rhonchi or crepitations. Cardiovascular: S1-S2 heard. Abdomen: Soft nontender bowel sound present.  Scrotal swelling seen with no obvious tenderness on my exam.  No active discharge from the penis. Musculoskeletal: No edema. Skin: Mild erythema of the scrotum involving the whole scrotum both sides with swelling and enlargement. Neurologic: Alert awake oriented to time place and person.  Moves all extremities. Psychiatric: Appears normal.  Normal affect.   Labs on Admission: I have personally reviewed following labs and imaging studies  CBC: Recent Labs  Lab 06/05/21 1829  WBC 16.6*  NEUTROABS 13.5*  HGB 16.7  HCT 51.6  MCV 88.1  PLT 334  Basic Metabolic Panel: Recent Labs  Lab 06/05/21 1829  NA 139  K 3.7  CL 106  CO2 24  GLUCOSE 102*  BUN 15  CREATININE 1.09  CALCIUM 9.4   GFR: Estimated Creatinine Clearance: 147.5 mL/min (by C-G formula based on SCr of 1.09 mg/dL). Liver Function Tests: Recent Labs  Lab 06/05/21 1829  AST 23  ALT 33  ALKPHOS 73  BILITOT 0.5  PROT 8.3*  ALBUMIN 4.3   No results for input(s): LIPASE, AMYLASE in the last 168 hours. No results for input(s): AMMONIA in the last 168 hours. Coagulation Profile: No results for input(s): INR, PROTIME in the last 168 hours. Cardiac Enzymes: No  results for input(s): CKTOTAL, CKMB, CKMBINDEX, TROPONINI in the last 168 hours. BNP (last 3 results) No results for input(s): PROBNP in the last 8760 hours. HbA1C: No results for input(s): HGBA1C in the last 72 hours. CBG: No results for input(s): GLUCAP in the last 168 hours. Lipid Profile: No results for input(s): CHOL, HDL, LDLCALC, TRIG, CHOLHDL, LDLDIRECT in the last 72 hours. Thyroid Function Tests: Recent Labs    06/05/21 1829  TSH 1.264   Anemia Panel: No results for input(s): VITAMINB12, FOLATE, FERRITIN, TIBC, IRON, RETICCTPCT in the last 72 hours. Urine analysis:    Component Value Date/Time   COLORURINE YELLOW 06/05/2021 1743   APPEARANCEUR CLEAR 06/05/2021 1743   LABSPEC 1.030 06/05/2021 1743   PHURINE 5.0 06/05/2021 1743   GLUCOSEU NEGATIVE 06/05/2021 1743   HGBUR NEGATIVE 06/05/2021 1743   BILIRUBINUR NEGATIVE 06/05/2021 1743   BILIRUBINUR neg 03/02/2014 1453   KETONESUR 5 (A) 06/05/2021 1743   PROTEINUR 30 (A) 06/05/2021 1743   UROBILINOGEN negative 03/02/2014 1453   NITRITE NEGATIVE 06/05/2021 1743   LEUKOCYTESUR NEGATIVE 06/05/2021 1743   Sepsis Labs: @LABRCNTIP (procalcitonin:4,lacticidven:4) ) Recent Results (from the past 240 hour(s))  Resp Panel by RT-PCR (Flu A&B, Covid) Nasopharyngeal Swab     Status: None   Collection Time: 06/05/21 11:56 PM   Specimen: Nasopharyngeal Swab; Nasopharyngeal(NP) swabs in vial transport medium  Result Value Ref Range Status   SARS Coronavirus 2 by RT PCR NEGATIVE NEGATIVE Final    Comment: (NOTE) SARS-CoV-2 target nucleic acids are NOT DETECTED.  The SARS-CoV-2 RNA is generally detectable in upper respiratory specimens during the acute phase of infection. The lowest concentration of SARS-CoV-2 viral copies this assay can detect is 138 copies/mL. A negative result does not preclude SARS-Cov-2 infection and should not be used as the sole basis for treatment or other patient management decisions. A negative result  may occur with  improper specimen collection/handling, submission of specimen other than nasopharyngeal swab, presence of viral mutation(s) within the areas targeted by this assay, and inadequate number of viral copies(<138 copies/mL). A negative result must be combined with clinical observations, patient history, and epidemiological information. The expected result is Negative.  Fact Sheet for Patients:  06/07/21  Fact Sheet for Healthcare Providers:  BloggerCourse.com  This test is no t yet approved or cleared by the [place] FDA and  has been authorized for detection and/or diagnosis of SARS-CoV-2 by FDA under an Emergency Use Authorization (EUA). This EUA will remain  in effect (meaning this test can be used) for the duration of the COVID-19 declaration under Section 564(b)(1) of the Act, 21 U.S.C.section 360bbb-3(b)(1), unless the authorization is terminated  or revoked sooner.       Influenza A by PCR NEGATIVE NEGATIVE Final   Influenza B by PCR NEGATIVE NEGATIVE Final  Comment: (NOTE) The Xpert Xpress SARS-CoV-2/FLU/RSV plus assay is intended as an aid in the diagnosis of influenza from Nasopharyngeal swab specimens and should not be used as a sole basis for treatment. Nasal washings and aspirates are unacceptable for Xpert Xpress SARS-CoV-2/FLU/RSV testing.  Fact Sheet for Patients: BloggerCourse.com  Fact Sheet for Healthcare Providers: SeriousBroker.it  This test is not yet approved or cleared by the [place] FDA and has been authorized for detection and/or diagnosis of SARS-CoV-2 by FDA under an Emergency Use Authorization (EUA). This EUA will remain in effect (meaning this test can be used) for the duration of the COVID-19 declaration under Section 564(b)(1) of the Act, 21 U.S.C. section 360bbb-3(b)(1), unless the authorization is terminated  or revoked.  Performed at [facility], [address], [place], [place] [address]      Radiological Exams on Admission: DG Chest 2 View  Result Date: 06/05/2021 CLINICAL DATA:  Scrotal edema/pruritis EXAM: CHEST - 2 VIEW COMPARISON:  09/06/2013 FINDINGS: Lungs are clear.  No pleural effusion or pneumothorax. The heart is normal in size. Visualized osseous structures are within normal limits. IMPRESSION: Normal chest radiographs. Electronically Signed   By: Charline Bills M.D.   On: 06/05/2021 19:25   CT Angio Chest PE W and/or Wo Contrast  Result Date: 06/05/2021 CLINICAL DATA:  PE suspected, high prob EXAM: CT ANGIOGRAPHY CHEST WITH CONTRAST TECHNIQUE: Multidetector CT imaging of the chest was performed using the standard protocol during bolus administration of intravenous contrast. Multiplanar CT image reconstructions and MIPs were obtained to evaluate the vascular anatomy. CONTRAST:  OMNIPAQUE IOHEXOL 350 MG/ML SOLN COMPARISON:  Radiograph earlier today FINDINGS: Cardiovascular: Technically suboptimal evaluation for pulmonary embolus given contrast bolus and soft tissue attenuation from habitus. Allowing for this, no pulmonary emboli are seen to the lobar level. Normal caliber thoracic aorta. Heart is normal in size. There is no pericardial effusion. Mediastinum/Nodes: No enlarged mediastinal or hilar lymph nodes. No esophageal wall thickening. No thyroid nodule. Lungs/Pleura: Known for mild motion artifact limitation, the lungs are clear. There is no focal airspace disease. No pleural fluid. No findings of pulmonary edema. No pulmonary mass or nodule. Trachea and central bronchi are patent. Upper Abdomen: Assessed on concurrent abdominopelvic CT, reported separately. Musculoskeletal: There are no acute or suspicious osseous abnormalities. Review of the MIP images confirms the above findings. IMPRESSION: 1. Technically suboptimal evaluation for pulmonary embolus given  contrast bolus and soft tissue attenuation from habitus. Allowing for this, no pulmonary emboli are seen to the lobar level. 2. No acute intrathoracic abnormality. Electronically Signed   By: Narda Rutherford M.D.   On: 06/05/2021 21:08   CT ABDOMEN PELVIS W CONTRAST  Result Date: 06/05/2021 CLINICAL DATA:  Provided history of: Scrotal mass or lump (Ped 0-18y) Patient reports scrotal pruritus and edema. EXAM: CT ABDOMEN AND PELVIS WITH CONTRAST TECHNIQUE: Multidetector CT imaging of the abdomen and pelvis was performed using the standard protocol following bolus administration of intravenous contrast. CONTRAST:  OMNIPAQUE IOHEXOL 350 MG/ML SOLN COMPARISON:  Scrotal ultrasound earlier today FINDINGS: Lower chest: Assessed on concurrent chest CTA, reported separately. Hepatobiliary: Prominent liver spanning 20.9 cm cranial caudal with diffuse steatosis. No discrete focal hepatic lesion. Decompressed gallbladder. No calcified gallstone. No biliary dilatation. Pancreas: No ductal dilatation or inflammation. Spleen: Normal in size without focal abnormality. Adrenals/Urinary Tract: Normal adrenal glands. No hydronephrosis or perinephric edema. Homogeneous renal enhancement. No visualized renal calculi or focal lesion. Urinary bladder is physiologically distended without wall thickening. Stomach/Bowel:  Stomach is within normal limits. Appendix appears normal. No evidence of bowel wall thickening, distention, or inflammatory changes. Vascular/Lymphatic: Normal caliber abdominal aorta. No portal venous or mesenteric gas. Multiple small retroperitoneal lymph nodes are likely reactive. Mildly enlarged right greater than left inguinal nodes, 12-16 mm short axis, also likely reactive. Reproductive: There is diffuse and marked scrotal skin thickening. No hydrocele is seen on scrotal ultrasound earlier today. There is no evidence of focal fluid collection. No soft tissue air. Unremarkable prostate. Other: No free air or  ascites. Tiny fat containing umbilical hernia. There is no inguinal hernia. Musculoskeletal: Hemi transitional lumbosacral anatomy with enlarged left transverse process at the transitional lumbosacral vertebra. There are no acute or suspicious osseous abnormalities. IMPRESSION: 1. Diffuse and marked scrotal skin thickening, consistent with cellulitis. No hydrocele was seen on scrotal ultrasound earlier today. No abscess or soft tissue air. No extra scrotal inflammation or extension of the perineum. 2. Hepatomegaly and hepatic steatosis. Electronically Signed   By: Narda Rutherford M.D.   On: 06/05/2021 21:05   US SCROTUM W/DOPPLER  Result Date: 06/05/2021 CLINICAL DATA:  Edema. Patient states he put topical ointment/cream for itching, now presenting with swelling. EXAM: SCROTAL ULTRASOUND DOPPLER ULTRASOUND OF THE TESTICLES TECHNIQUE: Complete ultrasound examination of the testicles, epididymis, and other scrotal structures was performed. Color and spectral Doppler ultrasound were also utilized to evaluate blood flow to the testicles. COMPARISON:  None. FINDINGS: Right testicle Measurements: 4.9 x 2.2 x 2.3 cm. No mass. Microlithiasis visualized. Left testicle Measurements: 4.8 x 2.9 x 2.6 cm. No mass. Microlithiasis visualized. Right epididymis:  Normal in size and appearance. Left epididymis:  Normal in size and appearance. Hydrocele:  None visualized. Varicocele:  None visualized. Pulsed Doppler interrogation of both testes demonstrates normal low resistance arterial and venous waveforms bilaterally. Other: Subcutaneus soft tissue edema of bilateral scrotum. No definite emphysema identified. IMPRESSION: 1. Subcutaneus soft tissue edema of bilateral scrotum. No definite emphysema identified. Please correlate clinically as a necrotizing fasciitis cannot be excluded. 2. Current literature suggests that testicular microlithiasis is not a significant independent risk factor for development of testicular carcinoma,  and that follow up imaging is not warranted in the absence of other risk factors. Monthly testicular self-examination and annual physical exams are considered appropriate surveillance. If patient has other risk factors for testicular carcinoma, then referral to Urology should be considered. (Reference: DeCastro, et al.: A 5-Year Follow up Study of Asymptomatic Men with Testicular Microlithiasis. J Urol 2008; 179:1420-1423.) 3. Otherwise unremarkable ultrasound of the testicles. Electronically Signed   By: Tish Frederickson M.D.   On: 06/05/2021 16:50      Assessment/Plan Principal Problem:   Sepsis (HCC) Active Problems:   Cellulitis   Cellulitis of scrotum    Sepsis secondary to scrotal cellulitis at this time no definite signs of any necrotizing fasciitis.  Patient is tachycardic with leukocytosis and source of infection meets with sepsis criteria.  We will closely observe.  Patient is on empiric antibiotics.  Follow cultures. Sinus tachycardia likely from developing sepsis.  We will closely monitor.  EKG pending.  Check TSH. 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  Given the patient has sepsis on presentation will need close monitoring and inpatient status.   DVT prophylaxis: Heparin. Code Status: Full code. Family Communication: Discussed with patient. Disposition Plan: Home. Consults called: None. Admission status: Inpatient.   Eduard Clos MD [facility] Pager [phone].  If 7PM-7AM, please contact night-coverage [email or web address] Password [email or web address]  06/06/2021, 1:51 AM

## 2021-06-06 NOTE — Progress Notes (Signed)
Patient admitted after midnight but care began prior to midnight, please see H&P.  Here after putting copaiba oil on his groin for "jock itch".   After starting the oil, he began to have itching.  He stopped putting on the oil 3 days ago but swelling/itching began to worsen.  In the ER the were concerns for cellulitis.  ? Local contact dermatitis reaction.  Will continue to monitor. Marlin Canary DO

## 2021-06-06 NOTE — ED Notes (Signed)
Patient gown changed. Ambulatory to restroom with steady gait

## 2021-06-06 NOTE — Progress Notes (Signed)
Pharmacy Antibiotic Note  Gary Chavez is a 30 y.o. male admitted on 06/05/2021 with sepsis.  Pharmacy has been consulted for Vancomycin dosing.  Patient received Vancomycin 2gm IV x 1 dose in the ED.  Plan: - Vancomycin 1750 mg IV Q 12 hrs. Goal AUC 400-550.  Expected AUC: 500.6  SCr used: 1.09 - Ceftriaxone per MD - f/u cultures - follow renal function  Height: 6' (182.9 cm) Weight: (!) 149 kg (328 lb 7.8 oz) IBW/kg (Calculated) : 77.6  Temp (24hrs), Avg:98.2 F (36.8 C), Min:98.2 F (36.8 C), Max:98.2 F (36.8 C)  Recent Labs  Lab 06/05/21 1829  WBC 16.6*  CREATININE 1.09  LATICACIDVEN 1.6    Estimated Creatinine Clearance: 147.5 mL/min (by C-G formula based on SCr of 1.09 mg/dL).    Allergies  Allergen Reactions   Penicillins Rash    Antimicrobials this admission: 8/15 Vancomycin >>   8/16 Ceftriaxone >>    Dose adjustments this admission:    Microbiology results: 8/15 BCx:    Thank you for allowing pharmacy to be a part of this patient's care.  Maryellen Pile, PharmD 06/06/2021 1:56 AM

## 2021-06-06 NOTE — Plan of Care (Signed)

## 2021-06-07 DIAGNOSIS — L03818 Cellulitis of other sites: Secondary | ICD-10-CM

## 2021-06-07 LAB — CBC
HCT: 44.9 % (ref 39.0–52.0)
Hemoglobin: 14.5 g/dL (ref 13.0–17.0)
MCH: 28.7 pg (ref 26.0–34.0)
MCHC: 32.3 g/dL (ref 30.0–36.0)
MCV: 88.7 fL (ref 80.0–100.0)
Platelets: 261 10*3/uL (ref 150–400)
RBC: 5.06 MIL/uL (ref 4.22–5.81)
RDW: 13.4 % (ref 11.5–15.5)
WBC: 15 10*3/uL — ABNORMAL HIGH (ref 4.0–10.5)
nRBC: 0 % (ref 0.0–0.2)

## 2021-06-07 LAB — MRSA NEXT GEN BY PCR, NASAL: MRSA by PCR Next Gen: NOT DETECTED

## 2021-06-07 LAB — BASIC METABOLIC PANEL
Anion gap: 8 (ref 5–15)
BUN: 14 mg/dL (ref 6–20)
CO2: 23 mmol/L (ref 22–32)
Calcium: 8.5 mg/dL — ABNORMAL LOW (ref 8.9–10.3)
Chloride: 105 mmol/L (ref 98–111)
Creatinine, Ser: 1.14 mg/dL (ref 0.61–1.24)
GFR, Estimated: >60 mL/min (ref >60)
Glucose, Bld: 92 mg/dL (ref 70–99)
Potassium: 3.5 mmol/L (ref 3.5–5.1)
Sodium: 136 mmol/L (ref 135–145)

## 2021-06-07 NOTE — Progress Notes (Signed)
PROGRESS NOTE    Gary Chavez  IZT:[id number] DOB: 1990-12-08 DOA: 06/05/2021 PCP: Pcp, No    Brief Narrative:  Gary Chavez is a 30 year old male with no significant past medical history who presented to [facility] ER with scrotal swelling and itching over the last 2 days.  Denies trauma or insect bite.  No penile discharge.  Denies fever/chills.  In the ER, patient was noted to be protestant only tachycardic with leukocytosis of 16.9.  Lactic acid within normal limits.  CT chest/abdomen/pelvis with notable with scrotal cellulitis without abscess, no fluid collection, no gas present; no PE noted.  Blood cultures were drawn and patient was started empiric antibiotics.  Dopplers of the scrotum shows skin thickening and testicular microlithiasis.  COVID-19 PCR negative.  TRH consulted for further evaluation management of scrotal cellulitis.   Assessment & Plan:   Principal Problem:   Sepsis (HCC) Active Problems:   Cellulitis   Cellulitis of scrotum   Sepsis, POA Cellulitis of the scrotum Patient presenting to the ED with 2-day history of scrotal swelling.  Patient was noted to be tachycardic, elevated BC count of 16.9 and CT abdomen/pelvis findings notable for scrotal cellulitis.  Denies any penile discharge.  Chlamydia/gonorrhea negative.  HIV nonreactive.  Urinalysis unrevealing.  Procalcitonin less than 0.10. --WBC 16.9>15.0 --Continue vancomycin, pharmacy consulted for dosing/monitoring --Ceftriaxone 2g IV q24h --Obtain MRSA PCR --Repeat CBC in the a.m. --Supportive care, scrotal elevation, ice; Benadryl as needed for itching  Testicular microlithiasis --Outpatient follow-up with urology   DVT prophylaxis: heparin injection 5,000 Units Start: 06/06/21 0600   Code Status: Full Code Family Communication: No family present at bedside this morning  Disposition Plan:  Level of care: Telemetry Status is: Inpatient  Remains inpatient appropriate because:IV treatments appropriate  due to intensity of illness or inability to take PO and Inpatient level of care appropriate due to severity of illness  Dispo: The patient is from: Home              Anticipated d/c is to: Home              Patient currently is not medically stable to d/c.   Difficult to place patient No   Consultants:  None  Procedures:  None  Antimicrobials:  Vancomycin Ceftriaxone   Subjective: Patient seen examined at bedside, resting comfortably.  Continues with scrotal irritation and occasional itching.  Discussed with patient natural course of scrotal cellulitis and edema and needs for scrotal elevation and ice as needed.  No other questions or concerns at this time.  Denies headache, no chest pain, no fever/chills/night sweats, no nausea/vomiting/diarrhea, no chest pain, palpitations, no shortness of breath, no abdominal pain, no cough/congestion, no weakness, no fatigue, no paresthesias.  No acute events overnight per nursing staff.  Objective: Vitals:   06/06/21 1400 06/06/21 1505 06/06/21 2023 06/07/21 0549  BP: 133/79 130/82 124/69 136/81  Pulse: (!) 109 98 90 98  Resp: 20 20 (!) 22 (!) 22  Temp: 99 F (37.2 C) 98.4 F (36.9 C) 98 F (36.7 C) 98.1 F (36.7 C)  TempSrc: Oral Oral Oral Oral  SpO2: 92% 99% 97% 99%  Weight:      Height:        Intake/Output Summary (Last 24 hours) at 06/07/2021 1508 Last data filed at 06/07/2021 1247 Gross per 24 hour  Intake 1663.17 ml  Output 500 ml  Net 1163.17 ml   Filed Weights   06/05/21 1520  Weight: (!) 149 kg  Examination:  General exam: Appears calm and comfortable  Respiratory system: Clear to auscultation. Respiratory effort normal. Cardiovascular system: S1 & S2 heard, RRR. No JVD, murmurs, rubs, gallops or clicks. No pedal edema. Gastrointestinal system: Abdomen is nondistended, soft and nontender. No organomegaly or masses felt. Normal bowel sounds heard. GU: Notable edema involving entire scrotum, no significant  erythema, mild tenderness to palpation, no fluctuant regions Central nervous system: Alert and oriented. No focal neurological deficits. Extremities: Symmetric 5 x 5 power. Skin: No rashes, lesions or ulcers Psychiatry: Judgement and insight appear normal. Mood & affect appropriate.     Data Reviewed: I have personally reviewed following labs and imaging studies  CBC: Recent Labs  Lab 06/05/21 1829 06/06/21 0517 06/07/21 0519  WBC 16.6* 16.9* 15.0*  NEUTROABS 13.5* 13.9*  --   HGB 16.7 15.5 14.5  HCT 51.6 47.5 44.9  MCV 88.1 87.8 88.7  PLT 334 261 261   Basic Metabolic Panel: Recent Labs  Lab 06/05/21 1829 06/06/21 0517 06/07/21 0519  NA 139 138 136  K 3.7 3.6 3.5  CL 106 106 105  CO2 24 21* 23  GLUCOSE 102* 109* 92  BUN 15 14 14   CREATININE 1.09 1.03 1.14  CALCIUM 9.4 9.1 8.5*   GFR: Estimated Creatinine Clearance: 141 mL/min (by C-G formula based on SCr of 1.14 mg/dL). Liver Function Tests: Recent Labs  Lab 06/05/21 1829 06/06/21 0517  AST 23 20  ALT 33 27  ALKPHOS 73 61  BILITOT 0.5 0.7  PROT 8.3* 7.2  ALBUMIN 4.3 3.6   No results for input(s): LIPASE, AMYLASE in the last 168 hours. No results for input(s): AMMONIA in the last 168 hours. Coagulation Profile: No results for input(s): INR, PROTIME in the last 168 hours. Cardiac Enzymes: No results for input(s): CKTOTAL, CKMB, CKMBINDEX, TROPONINI in the last 168 hours. BNP (last 3 results) No results for input(s): PROBNP in the last 8760 hours. HbA1C: No results for input(s): HGBA1C in the last 72 hours. CBG: No results for input(s): GLUCAP in the last 168 hours. Lipid Profile: No results for input(s): CHOL, HDL, LDLCALC, TRIG, CHOLHDL, LDLDIRECT in the last 72 hours. Thyroid Function Tests: Recent Labs    06/06/21 0517  TSH 1.491   Anemia Panel: No results for input(s): VITAMINB12, FOLATE, FERRITIN, TIBC, IRON, RETICCTPCT in the last 72 hours. Sepsis Labs: Recent Labs  Lab 06/05/21 1829  06/06/21 0517  PROCALCITON  --  <0.10  LATICACIDVEN 1.6 1.1    Recent Results (from the past 240 hour(s))  Blood culture (routine single)     Status: None (Preliminary result)   Collection Time: 06/05/21  6:29 PM   Specimen: BLOOD  Result Value Ref Range Status   Specimen Description   Final    BLOOD LEFT ANTECUBITAL Performed at [facility], [address], [place], [place]    Special Requests   Final    BOTTLES DRAWN AEROBIC AND ANAEROBIC Blood Culture adequate volume Performed at [facility], [address] [place], [place]    Culture   Final    NO GROWTH 2 DAYS Performed at [facility], [address] [place], [place]    Report Status PENDING  Incomplete  Resp Panel by RT-PCR (Flu A&B, Covid) Nasopharyngeal Swab     Status: None   Collection Time: 06/05/21 11:56 PM   Specimen: Nasopharyngeal Swab; Nasopharyngeal(NP) swabs in vial transport medium  Result Value Ref Range Status  SARS Coronavirus 2 by RT PCR NEGATIVE NEGATIVE Final    Comment: (NOTE) SARS-CoV-2 target nucleic acids are NOT DETECTED.  The SARS-CoV-2 RNA is generally detectable in upper respiratory specimens during the acute phase of infection. The lowest concentration of SARS-CoV-2 viral copies this assay can detect is 138 copies/mL. A negative result does not preclude SARS-Cov-2 infection and should not be used as the sole basis for treatment or other patient management decisions. A negative result may occur with  improper specimen collection/handling, submission of specimen other than nasopharyngeal swab, presence of viral mutation(s) within the areas targeted by this assay, and inadequate number of viral copies(<138 copies/mL). A negative result must be combined with clinical observations, patient history, and epidemiological information. The expected result is Negative.  Fact Sheet for Patients:   BloggerCourse.com  Fact Sheet for Healthcare Providers:  SeriousBroker.it  This test is no t yet approved or cleared by the [place] FDA and  has been authorized for detection and/or diagnosis of SARS-CoV-2 by FDA under an Emergency Use Authorization (EUA). This EUA will remain  in effect (meaning this test can be used) for the duration of the COVID-19 declaration under Section 564(b)(1) of the Act, 21 U.S.C.section 360bbb-3(b)(1), unless the authorization is terminated  or revoked sooner.       Influenza A by PCR NEGATIVE NEGATIVE Final   Influenza B by PCR NEGATIVE NEGATIVE Final    Comment: (NOTE) The Xpert Xpress SARS-CoV-2/FLU/RSV plus assay is intended as an aid in the diagnosis of influenza from Nasopharyngeal swab specimens and should not be used as a sole basis for treatment. Nasal washings and aspirates are unacceptable for Xpert Xpress SARS-CoV-2/FLU/RSV testing.  Fact Sheet for Patients: BloggerCourse.com  Fact Sheet for Healthcare Providers: SeriousBroker.it  This test is not yet approved or cleared by the [place] FDA and has been authorized for detection and/or diagnosis of SARS-CoV-2 by FDA under an Emergency Use Authorization (EUA). This EUA will remain in effect (meaning this test can be used) for the duration of the COVID-19 declaration under Section 564(b)(1) of the Act, 21 U.S.C. section 360bbb-3(b)(1), unless the authorization is terminated or revoked.  Performed at [facility], [address], [place], [place] [address]          Radiology Studies: DG Chest 2 View  Result Date: 06/05/2021 CLINICAL DATA:  Scrotal edema/pruritis EXAM: CHEST - 2 VIEW COMPARISON:  09/06/2013 FINDINGS: Lungs are clear.  No pleural effusion or pneumothorax. The heart is normal in size. Visualized osseous structures are within normal limits.  IMPRESSION: Normal chest radiographs. Electronically Signed   By: Charline Bills M.D.   On: 06/05/2021 19:25   CT Angio Chest PE W and/or Wo Contrast  Result Date: 06/05/2021 CLINICAL DATA:  PE suspected, high prob EXAM: CT ANGIOGRAPHY CHEST WITH CONTRAST TECHNIQUE: Multidetector CT imaging of the chest was performed using the standard protocol during bolus administration of intravenous contrast. Multiplanar CT image reconstructions and MIPs were obtained to evaluate the vascular anatomy. CONTRAST:  OMNIPAQUE IOHEXOL 350 MG/ML SOLN COMPARISON:  Radiograph earlier today FINDINGS: Cardiovascular: Technically suboptimal evaluation for pulmonary embolus given contrast bolus and soft tissue attenuation from habitus. Allowing for this, no pulmonary emboli are seen to the lobar level. Normal caliber thoracic aorta. Heart is normal in size. There is no pericardial effusion. Mediastinum/Nodes: No enlarged mediastinal or hilar lymph nodes. No esophageal wall thickening. No thyroid nodule. Lungs/Pleura: Known for mild motion artifact limitation, the lungs are clear. There is no  focal airspace disease. No pleural fluid. No findings of pulmonary edema. No pulmonary mass or nodule. Trachea and central bronchi are patent. Upper Abdomen: Assessed on concurrent abdominopelvic CT, reported separately. Musculoskeletal: There are no acute or suspicious osseous abnormalities. Review of the MIP images confirms the above findings. IMPRESSION: 1. Technically suboptimal evaluation for pulmonary embolus given contrast bolus and soft tissue attenuation from habitus. Allowing for this, no pulmonary emboli are seen to the lobar level. 2. No acute intrathoracic abnormality. Electronically Signed   By: Narda RutherfordMelanie  Sanford M.D.   On: 06/05/2021 21:08   CT ABDOMEN PELVIS W CONTRAST  Result Date: 06/05/2021 CLINICAL DATA:  Provided history of: Scrotal mass or lump (Ped 0-18y) Patient reports scrotal pruritus and edema. EXAM: CT ABDOMEN  AND PELVIS WITH CONTRAST TECHNIQUE: Multidetector CT imaging of the abdomen and pelvis was performed using the standard protocol following bolus administration of intravenous contrast. CONTRAST:  100mL OMNIPAQUE IOHEXOL 350 MG/ML SOLN COMPARISON:  Scrotal ultrasound earlier today FINDINGS: Lower chest: Assessed on concurrent chest CTA, reported separately. Hepatobiliary: Prominent liver spanning 20.9 cm cranial caudal with diffuse steatosis. No discrete focal hepatic lesion. Decompressed gallbladder. No calcified gallstone. No biliary dilatation. Pancreas: No ductal dilatation or inflammation. Spleen: Normal in size without focal abnormality. Adrenals/Urinary Tract: Normal adrenal glands. No hydronephrosis or perinephric edema. Homogeneous renal enhancement. No visualized renal calculi or focal lesion. Urinary bladder is physiologically distended without wall thickening. Stomach/Bowel: Stomach is within normal limits. Appendix appears normal. No evidence of bowel wall thickening, distention, or inflammatory changes. Vascular/Lymphatic: Normal caliber abdominal aorta. No portal venous or mesenteric gas. Multiple small retroperitoneal lymph nodes are likely reactive. Mildly enlarged right greater than left inguinal nodes, 12-16 mm short axis, also likely reactive. Reproductive: There is diffuse and marked scrotal skin thickening. No hydrocele is seen on scrotal ultrasound earlier today. There is no evidence of focal fluid collection. No soft tissue air. Unremarkable prostate. Other: No free air or ascites. Tiny fat containing umbilical hernia. There is no inguinal hernia. Musculoskeletal: Hemi transitional lumbosacral anatomy with enlarged left transverse process at the transitional lumbosacral vertebra. There are no acute or suspicious osseous abnormalities. IMPRESSION: 1. Diffuse and marked scrotal skin thickening, consistent with cellulitis. No hydrocele was seen on scrotal ultrasound earlier today. No abscess or  soft tissue air. No extra scrotal inflammation or extension of the perineum. 2. Hepatomegaly and hepatic steatosis. Electronically Signed   By: Narda RutherfordMelanie  Sanford M.D.   On: 06/05/2021 21:05   US SCROTUM W/DOPPLER  Result Date: 06/05/2021 CLINICAL DATA:  Edema. Patient states he put topical ointment/cream for itching, now presenting with swelling. EXAM: SCROTAL ULTRASOUND DOPPLER ULTRASOUND OF THE TESTICLES TECHNIQUE: Complete ultrasound examination of the testicles, epididymis, and other scrotal structures was performed. Color and spectral Doppler ultrasound were also utilized to evaluate blood flow to the testicles. COMPARISON:  None. FINDINGS: Right testicle Measurements: 4.9 x 2.2 x 2.3 cm. No mass. Microlithiasis visualized. Left testicle Measurements: 4.8 x 2.9 x 2.6 cm. No mass. Microlithiasis visualized. Right epididymis:  Normal in size and appearance. Left epididymis:  Normal in size and appearance. Hydrocele:  None visualized. Varicocele:  None visualized. Pulsed Doppler interrogation of both testes demonstrates normal low resistance arterial and venous waveforms bilaterally. Other: Subcutaneus soft tissue edema of bilateral scrotum. No definite emphysema identified. IMPRESSION: 1. Subcutaneus soft tissue edema of bilateral scrotum. No definite emphysema identified. Please correlate clinically as a necrotizing fasciitis cannot be excluded. 2. Current literature suggests that testicular microlithiasis is not a significant  independent risk factor for development of testicular carcinoma, and that follow up imaging is not warranted in the absence of other risk factors. Monthly testicular self-examination and annual physical exams are considered appropriate surveillance. If patient has other risk factors for testicular carcinoma, then referral to Urology should be considered. (Reference: DeCastro, et al.: A 5-Year Follow up Study of Asymptomatic Men with Testicular Microlithiasis. J Urol 2008;  [id number].) 3. Otherwise unremarkable ultrasound of the testicles. Electronically Signed   By: Tish Frederickson M.D.   On: 06/05/2021 16:50        Scheduled Meds:  heparin  5,000 Units Subcutaneous Q8H   Continuous Infusions:  cefTRIAXone (ROCEPHIN)  IV Stopped (06/07/21 0317)   vancomycin Stopped (06/07/21 0852)     LOS: 1 day    Time spent: 39 minutes spent on chart review, discussion with nursing staff, consultants, updating family and interview/physical exam; more than 50% of that time was spent in counseling and/or coordination of care.    Alvira Philips [place], DO [facility] Available via Epic secure chat 7am-7pm After these hours, please refer to coverage provider listed on [email or web address] 06/07/2021, 3:08 PM

## 2021-06-07 NOTE — Progress Notes (Signed)
Patient had a 8 beat run of v tach, patient was asymptomatic. MD is aware.

## 2021-06-08 LAB — BASIC METABOLIC PANEL
Anion gap: 8 (ref 5–15)
BUN: 11 mg/dL (ref 6–20)
CO2: 23 mmol/L (ref 22–32)
Calcium: 8.5 mg/dL — ABNORMAL LOW (ref 8.9–10.3)
Chloride: 105 mmol/L (ref 98–111)
Creatinine, Ser: 1.01 mg/dL (ref 0.61–1.24)
GFR, Estimated: >60 mL/min (ref >60)
Glucose, Bld: 90 mg/dL (ref 70–99)
Potassium: 3.5 mmol/L (ref 3.5–5.1)
Sodium: 136 mmol/L (ref 135–145)

## 2021-06-08 LAB — CBC
HCT: 42.5 % (ref 39.0–52.0)
Hemoglobin: 13.8 g/dL (ref 13.0–17.0)
MCH: 28.5 pg (ref 26.0–34.0)
MCHC: 32.5 g/dL (ref 30.0–36.0)
MCV: 87.8 fL (ref 80.0–100.0)
Platelets: 271 10*3/uL (ref 150–400)
RBC: 4.84 MIL/uL (ref 4.22–5.81)
RDW: 13.2 % (ref 11.5–15.5)
WBC: 12.4 10*3/uL — ABNORMAL HIGH (ref 4.0–10.5)
nRBC: 0 % (ref 0.0–0.2)

## 2021-06-08 LAB — MAGNESIUM: Magnesium: 2.1 mg/dL (ref 1.7–2.4)

## 2021-06-08 MED ORDER — CEPHALEXIN 500 MG PO CAPS: 500.0000 mg | ORAL_CAPSULE | Freq: Four times a day (QID) | ORAL | 0 refills | Status: AC

## 2021-06-08 MED ORDER — DOXYCYCLINE HYCLATE 100 MG PO TBEC: 100.0000 mg | DELAYED_RELEASE_TABLET | Freq: Two times a day (BID) | ORAL | 0 refills | Status: AC

## 2021-06-08 NOTE — Discharge Summary (Signed)
Physician Discharge Summary  Gary Chavez WUJ:[id number] DOB: Feb 19, 1991 DOA: 06/05/2021  PCP: Oneita Hurt, No  Admit date: 06/05/2021 Discharge date: 06/08/2021  Admitted From: Home  Disposition: Home   Recommendations for Outpatient Follow-up:  Follow up with PCP in 1-2 weeks Follow-up with Urology in 1 to 2 weeks Continue antibiotics with doxycycline and Keflex to complete 10-day course for scrotal cellulitis  Home Health: No Equipment/Devices: None  Discharge Condition: Stable CODE STATUS: Full code Diet recommendation: Regular diet  History of present illness:  Gary Chavez is a 30 year old male with no significant past medical history who presented to [facility] ER with scrotal swelling and itching over the last 2 days.  Denies trauma or insect bite.  No penile discharge.  Denies fever/chills.   In the ER, patient was noted to be protestant only tachycardic with leukocytosis of 16.9.  Lactic acid within normal limits.  CT chest/abdomen/pelvis with notable with scrotal cellulitis without abscess, no fluid collection, no gas present; no PE noted.  Blood cultures were drawn and patient was started empiric antibiotics.  Dopplers of the scrotum shows skin thickening and testicular microlithiasis.  COVID-19 PCR negative.  TRH consulted for further evaluation management of scrotal cellulitis.    Hospital course:  Sepsis, POA Cellulitis of the scrotum Patient presenting to the ED with 2-day history of scrotal swelling.  Patient was noted to be tachycardic, elevated BC count of 16.9 and CT abdomen/pelvis findings notable for scrotal cellulitis.  Denies any penile discharge.  Chlamydia/gonorrhea negative.  HIV nonreactive.  Urinalysis unrevealing.  Procalcitonin less than 0.10.  Patient was started on empiric antibiotics with vancomycin and ceftriaxone.  White blood cell count improved from 16.9-12.4 at time of discharge.  Patient's edema and erythema have improved.  Will discharge home on doxycycline  100 mg p.o. twice daily and Keflex 500 mg p.o. every 6 hours to complete 10-day course.  Outpatient follow-up with urology.  Discussed supportive care, scrotal elevation, ice, Benadryl as needed for itching.   Testicular microlithiasis Outpatient follow-up with urology  Discharge Diagnoses:  Active Problems:   Cellulitis   Cellulitis of scrotum    Discharge Instructions  Discharge Instructions     Call MD for:  difficulty breathing, headache or visual disturbances   Complete by: As directed    Call MD for:  extreme fatigue   Complete by: As directed    Call MD for:  persistant dizziness or light-headedness   Complete by: As directed    Call MD for:  persistant nausea and vomiting   Complete by: As directed    Call MD for:  severe uncontrolled pain   Complete by: As directed    Call MD for:  temperature >100.4   Complete by: As directed    Diet - low sodium heart healthy   Complete by: As directed    Increase activity slowly   Complete by: As directed       Allergies as of 06/08/2021       Reactions   Penicillins Rash        Medication List     STOP taking these medications    diphenhydrAMINE 25 MG tablet Commonly known as: BENADRYL       TAKE these medications    cephALEXin 500 MG capsule Commonly known as: KEFLEX Take 1 capsule (500 mg total) by mouth 4 (four) times daily for 10 days.   doxycycline 100 MG EC tablet Commonly known as: DORYX Take 1 tablet (100 mg total) by mouth 2 (  two) times daily for 10 days.   ketoconazole 2 % cream Commonly known as: NIZORAL Apply 1 application topically daily.        Follow-up Information     [facility]. Schedule an appointment as soon as possible for a visit .   Contact information: [address] [place] [address] [place] [address] [phone]               Allergies  Allergen Reactions   Penicillins Rash    Consultations: none   Procedures/Studies: DG Chest 2  View  Result Date: 06/05/2021 CLINICAL DATA:  Scrotal edema/pruritis EXAM: CHEST - 2 VIEW COMPARISON:  09/06/2013 FINDINGS: Lungs are clear.  No pleural effusion or pneumothorax. The heart is normal in size. Visualized osseous structures are within normal limits. IMPRESSION: Normal chest radiographs. Electronically Signed   By: Charline Bills M.D.   On: 06/05/2021 19:25   CT Angio Chest PE W and/or Wo Contrast  Result Date: 06/05/2021 CLINICAL DATA:  PE suspected, high prob EXAM: CT ANGIOGRAPHY CHEST WITH CONTRAST TECHNIQUE: Multidetector CT imaging of the chest was performed using the standard protocol during bolus administration of intravenous contrast. Multiplanar CT image reconstructions and MIPs were obtained to evaluate the vascular anatomy. CONTRAST:  OMNIPAQUE IOHEXOL 350 MG/ML SOLN COMPARISON:  Radiograph earlier today FINDINGS: Cardiovascular: Technically suboptimal evaluation for pulmonary embolus given contrast bolus and soft tissue attenuation from habitus. Allowing for this, no pulmonary emboli are seen to the lobar level. Normal caliber thoracic aorta. Heart is normal in size. There is no pericardial effusion. Mediastinum/Nodes: No enlarged mediastinal or hilar lymph nodes. No esophageal wall thickening. No thyroid nodule. Lungs/Pleura: Known for mild motion artifact limitation, the lungs are clear. There is no focal airspace disease. No pleural fluid. No findings of pulmonary edema. No pulmonary mass or nodule. Trachea and central bronchi are patent. Upper Abdomen: Assessed on concurrent abdominopelvic CT, reported separately. Musculoskeletal: There are no acute or suspicious osseous abnormalities. Review of the MIP images confirms the above findings. IMPRESSION: 1. Technically suboptimal evaluation for pulmonary embolus given contrast bolus and soft tissue attenuation from habitus. Allowing for this, no pulmonary emboli are seen to the lobar level. 2. No acute intrathoracic  abnormality. Electronically Signed   By: Narda Rutherford M.D.   On: 06/05/2021 21:08   CT ABDOMEN PELVIS W CONTRAST  Result Date: 06/05/2021 CLINICAL DATA:  Provided history of: Scrotal mass or lump (Ped 0-18y) Patient reports scrotal pruritus and edema. EXAM: CT ABDOMEN AND PELVIS WITH CONTRAST TECHNIQUE: Multidetector CT imaging of the abdomen and pelvis was performed using the standard protocol following bolus administration of intravenous contrast. CONTRAST:  OMNIPAQUE IOHEXOL 350 MG/ML SOLN COMPARISON:  Scrotal ultrasound earlier today FINDINGS: Lower chest: Assessed on concurrent chest CTA, reported separately. Hepatobiliary: Prominent liver spanning 20.9 cm cranial caudal with diffuse steatosis. No discrete focal hepatic lesion. Decompressed gallbladder. No calcified gallstone. No biliary dilatation. Pancreas: No ductal dilatation or inflammation. Spleen: Normal in size without focal abnormality. Adrenals/Urinary Tract: Normal adrenal glands. No hydronephrosis or perinephric edema. Homogeneous renal enhancement. No visualized renal calculi or focal lesion. Urinary bladder is physiologically distended without wall thickening. Stomach/Bowel: Stomach is within normal limits. Appendix appears normal. No evidence of bowel wall thickening, distention, or inflammatory changes. Vascular/Lymphatic: Normal caliber abdominal aorta. No portal venous or mesenteric gas. Multiple small retroperitoneal lymph nodes are likely reactive. Mildly enlarged right greater than left inguinal nodes, 12-16 mm short axis, also likely reactive. Reproductive: There is  diffuse and marked scrotal skin thickening. No hydrocele is seen on scrotal ultrasound earlier today. There is no evidence of focal fluid collection. No soft tissue air. Unremarkable prostate. Other: No free air or ascites. Tiny fat containing umbilical hernia. There is no inguinal hernia. Musculoskeletal: Hemi transitional lumbosacral anatomy with enlarged left  transverse process at the transitional lumbosacral vertebra. There are no acute or suspicious osseous abnormalities. IMPRESSION: 1. Diffuse and marked scrotal skin thickening, consistent with cellulitis. No hydrocele was seen on scrotal ultrasound earlier today. No abscess or soft tissue air. No extra scrotal inflammation or extension of the perineum. 2. Hepatomegaly and hepatic steatosis. Electronically Signed   By: Narda Rutherford M.D.   On: 06/05/2021 21:05   US SCROTUM W/DOPPLER  Result Date: 06/05/2021 CLINICAL DATA:  Edema. Patient states he put topical ointment/cream for itching, now presenting with swelling. EXAM: SCROTAL ULTRASOUND DOPPLER ULTRASOUND OF THE TESTICLES TECHNIQUE: Complete ultrasound examination of the testicles, epididymis, and other scrotal structures was performed. Color and spectral Doppler ultrasound were also utilized to evaluate blood flow to the testicles. COMPARISON:  None. FINDINGS: Right testicle Measurements: 4.9 x 2.2 x 2.3 cm. No mass. Microlithiasis visualized. Left testicle Measurements: 4.8 x 2.9 x 2.6 cm. No mass. Microlithiasis visualized. Right epididymis:  Normal in size and appearance. Left epididymis:  Normal in size and appearance. Hydrocele:  None visualized. Varicocele:  None visualized. Pulsed Doppler interrogation of both testes demonstrates normal low resistance arterial and venous waveforms bilaterally. Other: Subcutaneus soft tissue edema of bilateral scrotum. No definite emphysema identified. IMPRESSION: 1. Subcutaneus soft tissue edema of bilateral scrotum. No definite emphysema identified. Please correlate clinically as a necrotizing fasciitis cannot be excluded. 2. 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 Monthly testicular self-examination and annual physical exams are  considered appropriate surveillance. If patient has other risk factors for testicular carcinoma, then referral to Urology should be considered. (Reference: DeCastro, et al.: A 5-Year Follow up Study of Asymptomatic Men with Testicular Microlithiasis. J Urol 2008; 179:1420-1423.) 3. Otherwise unremarkable ultrasound of the testicles. Electronically Signed   By: Tish Frederickson M.D.   On: 06/05/2021 16:50     Subjective: Patient seen examined bedside, resting comfortably.  No complaints.  States swelling has improved.  Ready for discharge home.  Discussed with patient about supportive care, elevation of scrotum, ice and Benadryl as needed.  Also discussed outpatient follow-up with urology, needs to schedule appointment.  Denies headache, no fever/chills/night sweats, no nausea/vomiting/diarrhea, no chest pain, palpitations, shortness of breath, no abdominal pain.  No acute events overnight per nursing staff.  Discharge Exam: Vitals:   06/07/21 2130 06/08/21 0536  BP:  125/72  Pulse: 98 (!) 103  Resp: 20 20  Temp:  98.3 F (36.8 C)  SpO2: 95% 93%   Vitals:   06/07/21 1510 06/07/21 1948 06/07/21 2130 06/08/21 0536  BP: 124/75 132/81  125/72  Pulse: 95 (!) 102 98 (!) 103  Resp: (!) Temp: 99.2 F (37.3 C) 98.8 F (37.1 C)  98.3 F (36.8 C)  TempSrc: Oral Oral  Oral  SpO2: 99% 100% 95% 93%  Weight:      Height:        General: Pt is alert, awake, not in acute distress Cardiovascular: RRR, S1/S2 +, no rubs, no gallops Respiratory: CTA bilaterally, no wheezing, no  rhonchi Abdominal: Soft, NT, ND, bowel sounds + GU: Scrotal edema, improved with resolution of erythema Extremities: no edema, no cyanosis    The results of significant diagnostics from this hospitalization (including imaging, microbiology, ancillary and laboratory) are listed below for reference.     Microbiology: Recent Results (from the past 240 hour(s))  Blood culture (routine single)     Status: None  (Preliminary result)   Collection Time: 06/05/21  6:29 PM   Specimen: BLOOD  Result Value Ref Range Status   Specimen Description   Final    BLOOD LEFT ANTECUBITAL Performed at [facility], [address], [place], [place] [address]    Special Requests   Final    BOTTLES DRAWN AEROBIC AND ANAEROBIC Blood Culture adequate volume Performed at [facility], [address], [place], [place] [address]    Culture   Final    NO GROWTH 2 DAYS Performed at [facility], [address], [place], [place] [address]    Report Status PENDING  Incomplete  Resp Panel by RT-PCR (Flu A&B, Covid) Nasopharyngeal Swab     Status: None   Collection Time: 06/05/21 11:56 PM   Specimen: Nasopharyngeal Swab; Nasopharyngeal(NP) swabs in vial transport medium  Result Value Ref Range Status   SARS Coronavirus 2 by RT PCR NEGATIVE NEGATIVE Final    Comment: (NOTE) SARS-CoV-2 target nucleic acids are NOT DETECTED.  The SARS-CoV-2 RNA is generally detectable in upper respiratory specimens during the acute phase of infection. The lowest concentration of SARS-CoV-2 viral copies this assay can detect is 138 copies/mL. A negative result does not preclude SARS-Cov-2 infection and should not be used as the sole basis for treatment or other patient management decisions. A negative result may occur with  improper specimen collection/handling, submission of specimen other than nasopharyngeal swab, presence of viral mutation(s) within the areas targeted by this assay, and inadequate number of viral copies(<138 copies/mL). A negative result must be combined with clinical observations, patient history, and epidemiological information. The expected result is Negative.  Fact Sheet for Patients:  BloggerCourse.com  Fact Sheet for Healthcare Providers:  SeriousBroker.it  This test is no t yet approved or cleared by the [place] FDA and  has been authorized for detection and/or diagnosis of SARS-CoV-2 by FDA under an Emergency Use Authorization (EUA). This EUA will remain  in effect (meaning this test can be used) for the duration of the COVID-19 declaration under Section 564(b)(1) of the Act, 21 U.S.C.section 360bbb-3(b)(1), unless the authorization is terminated  or revoked sooner.       Influenza A by PCR NEGATIVE NEGATIVE Final   Influenza B by PCR NEGATIVE NEGATIVE Final    Comment: (NOTE) The Xpert Xpress SARS-CoV-2/FLU/RSV plus assay is intended as an aid in the diagnosis of influenza from Nasopharyngeal swab specimens and should not be used as a sole basis for treatment. Nasal washings and aspirates are unacceptable for Xpert Xpress SARS-CoV-2/FLU/RSV testing.  Fact Sheet for Patients: BloggerCourse.com  Fact Sheet for Healthcare Providers: SeriousBroker.it  This test is not yet approved or cleared by the [place] FDA and has been authorized for detection and/or diagnosis of SARS-CoV-2 by FDA under an Emergency Use Authorization (EUA). This EUA will remain in effect (meaning this test can be used) for the duration of the COVID-19 declaration under Section 564(b)(1) of the Act, 21 U.S.C. section 360bbb-3(b)(1), unless the authorization is terminated or revoked.  Performed at [facility], [address], [place], [place] [address]   MRSA Next Gen by PCR, Nasal     Status: None   Collection Time: 06/07/21  3:06 PM   Specimen: Nasal Mucosa; Nasal Swab  Result Value Ref Range Status   MRSA by PCR Next Gen NOT DETECTED NOT DETECTED Final    Comment: (NOTE) The GeneXpert MRSA Assay (FDA approved for NASAL specimens only), is one component of a comprehensive MRSA colonization surveillance program. It is not intended to diagnose MRSA infection nor to guide or monitor treatment for MRSA infections. Test performance is  not FDA approved in patients less than 68 years old. Performed at [facility], [address], [place], [place] [address]      Labs: BNP (last 3 results) Recent Labs    06/05/21 1829  BNP 17.2   Basic Metabolic Panel: Recent Labs  Lab 06/05/21 1829 06/06/21 0517 06/07/21 0519 06/08/21 0805  NA 139 138 136 136  K 3.7 3.6 3.5 3.5  CL 106 106 105 105  CO2 24 21* 23 23  GLUCOSE 102* 109* 92 90  BUN 15 14 14 11   CREATININE 1.09 1.03 1.14 1.01  CALCIUM 9.4 9.1 8.5* 8.5*  MG  --   --   --  2.1   Liver Function Tests: Recent Labs  Lab 06/05/21 1829 06/06/21 0517  AST 23 20  ALT 33 27  ALKPHOS 73 61  BILITOT 0.5 0.7  PROT 8.3* 7.2  ALBUMIN 4.3 3.6   No results for input(s): LIPASE, AMYLASE in the last 168 hours. No results for input(s): AMMONIA in the last 168 hours. CBC: Recent Labs  Lab 06/05/21 1829 06/06/21 0517 06/07/21 0519 06/08/21 0805  WBC 16.6* 16.9* 15.0* 12.4*  NEUTROABS 13.5* 13.9*  --   --   HGB 16.7 15.5 14.5 13.8  HCT 51.6 47.5 44.9 42.5  MCV 88.1 87.8 88.7 87.8  PLT 334 261 261 271   Cardiac Enzymes: No results for input(s): CKTOTAL, CKMB, CKMBINDEX, TROPONINI in the last 168 hours. BNP: Invalid input(s): POCBNP CBG: No results for input(s): GLUCAP in the last 168 hours. D-Dimer No results for input(s): DDIMER in the last 72 hours. Hgb A1c No results for input(s): HGBA1C in the last 72 hours. Lipid Profile No results for input(s): CHOL, HDL, LDLCALC, TRIG, CHOLHDL, LDLDIRECT in the last 72 hours. Thyroid function studies Recent Labs    06/06/21 0517  TSH 1.491   Anemia work up No results for input(s): VITAMINB12, FOLATE, FERRITIN, TIBC, IRON, RETICCTPCT in the last 72 hours. Urinalysis    Component Value Date/Time   COLORURINE YELLOW 06/05/2021 1743   APPEARANCEUR CLEAR 06/05/2021 1743   LABSPEC 1.030 06/05/2021 1743   PHURINE 5.0 06/05/2021 1743   GLUCOSEU NEGATIVE 06/05/2021 1743   HGBUR NEGATIVE  06/05/2021 1743   BILIRUBINUR NEGATIVE 06/05/2021 1743   BILIRUBINUR neg 03/02/2014 1453   KETONESUR 5 (A) 06/05/2021 1743   PROTEINUR 30 (A) 06/05/2021 1743   UROBILINOGEN negative 03/02/2014 1453   NITRITE NEGATIVE 06/05/2021 1743   LEUKOCYTESUR NEGATIVE 06/05/2021 1743   Sepsis Labs Invalid input(s): PROCALCITONIN,  WBC,  LACTICIDVEN Microbiology Recent Results (from the past 240 hour(s))  Blood culture (routine single)     Status: None (Preliminary result)   Collection Time: 06/05/21  6:29 PM   Specimen: BLOOD  Result Value Ref Range Status   Specimen Description   Final    BLOOD LEFT ANTECUBITAL Performed at [facility], [address], [place], [place]  Special Requests   Final    BOTTLES DRAWN AEROBIC AND ANAEROBIC Blood Culture adequate volume Performed at [facility], [address], [place], [place] [address]    Culture   Final    NO GROWTH 2 DAYS Performed at [facility], [address], [place], [place] [address]    Report Status PENDING  Incomplete  Resp Panel by RT-PCR (Flu A&B, Covid) Nasopharyngeal Swab     Status: None   Collection Time: 06/05/21 11:56 PM   Specimen: Nasopharyngeal Swab; Nasopharyngeal(NP) swabs in vial transport medium  Result Value Ref Range Status   SARS Coronavirus 2 by RT PCR NEGATIVE NEGATIVE Final    Comment: (NOTE) SARS-CoV-2 target nucleic acids are NOT DETECTED.  The SARS-CoV-2 RNA is generally detectable in upper respiratory specimens during the acute phase of infection. The lowest concentration of SARS-CoV-2 viral copies this assay can detect is 138 copies/mL. A negative result does not preclude SARS-Cov-2 infection and should not be used as the sole basis for treatment or other patient management decisions. A negative result may occur with  improper specimen collection/handling, submission of specimen other than nasopharyngeal swab, presence of viral mutation(s)  within the areas targeted by this assay, and inadequate number of viral copies(<138 copies/mL). A negative result must be combined with clinical observations, patient history, and epidemiological information. The expected result is Negative.  Fact Sheet for Patients:  BloggerCourse.com  Fact Sheet for Healthcare Providers:  SeriousBroker.it  This test is no t yet approved or cleared by the [place] FDA and  has been authorized for detection and/or diagnosis of SARS-CoV-2 by FDA under an Emergency Use Authorization (EUA). This EUA will remain  in effect (meaning this test can be used) for the duration of the COVID-19 declaration under Section 564(b)(1) of the Act, 21 U.S.C.section 360bbb-3(b)(1), unless the authorization is terminated  or revoked sooner.       Influenza A by PCR NEGATIVE NEGATIVE Final   Influenza B by PCR NEGATIVE NEGATIVE Final    Comment: (NOTE) The Xpert Xpress SARS-CoV-2/FLU/RSV plus assay is intended as an aid in the diagnosis of influenza from Nasopharyngeal swab specimens and should not be used as a sole basis for treatment. Nasal washings and aspirates are unacceptable for Xpert Xpress SARS-CoV-2/FLU/RSV testing.  Fact Sheet for Patients: BloggerCourse.com  Fact Sheet for Healthcare Providers: SeriousBroker.it  This test is not yet approved or cleared by the [place] FDA and has been authorized for detection and/or diagnosis of SARS-CoV-2 by FDA under an Emergency Use Authorization (EUA). This EUA will remain in effect (meaning this test can be used) for the duration of the COVID-19 declaration under Section 564(b)(1) of the Act, 21 U.S.C. section 360bbb-3(b)(1), unless the authorization is terminated or revoked.  Performed at [facility], [address], [place], [place] [address]   MRSA Next Gen by PCR, Nasal      Status: None   Collection Time: 06/07/21  3:06 PM   Specimen: Nasal Mucosa; Nasal Swab  Result Value Ref Range Status   MRSA by PCR Next Gen NOT DETECTED NOT DETECTED Final    Comment: (NOTE) The GeneXpert MRSA Assay (FDA approved for NASAL specimens only), is one component of a comprehensive MRSA colonization surveillance program. It is not intended to diagnose MRSA infection nor to guide or monitor treatment for MRSA infections. Test performance is not FDA approved in patients less than 65 years old. Performed at [facility], [address] [place], [place] [id number]      Time coordinating discharge: Over 30 minutes  SIGNED:   Alvira PhilipsEric J [place], DO  [facility] 06/08/2021, 10:36 AM

## 2021-06-10 LAB — CULTURE, BLOOD (SINGLE)
Culture: NO GROWTH
Special Requests: ADEQUATE

## 2021-11-19 IMAGING — CT CT ABD-PELV W/ CM
2 of 5 series · 16 of 46 positions shown, 18 images · IV contrast (omnipaque)
Comparison: Scrotal ultrasound earlier today

CLINICAL DATA: Provided history of: Scrotal mass or lump (Ped
0-18y)

Patient reports scrotal pruritus and edema.
EXAM:
CT ABDOMEN AND PELVIS WITH CONTRAST
TECHNIQUE: Multidetector CT imaging of the abdomen and pelvis was performed
using the standard protocol following bolus administration of
intravenous contrast.
CONTRAST:  100mL OMNIPAQUE IOHEXOL 350 MG/ML SOLN

[Series 2: axial st · axial · 0.93mm/px · z∈[+898,+1458]mm · 13 of 130 slices shown, 15 images]
[im 9/130  soft-tissue]
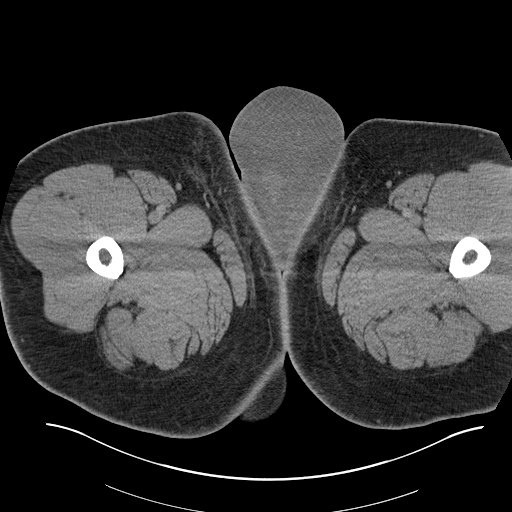
[im 9/130  bone]
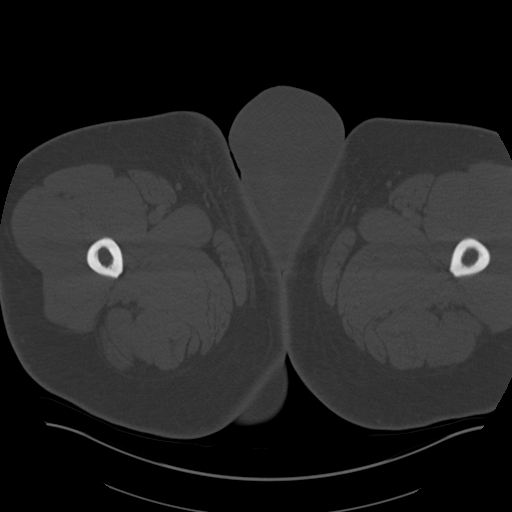
[im 17/130  soft-tissue]
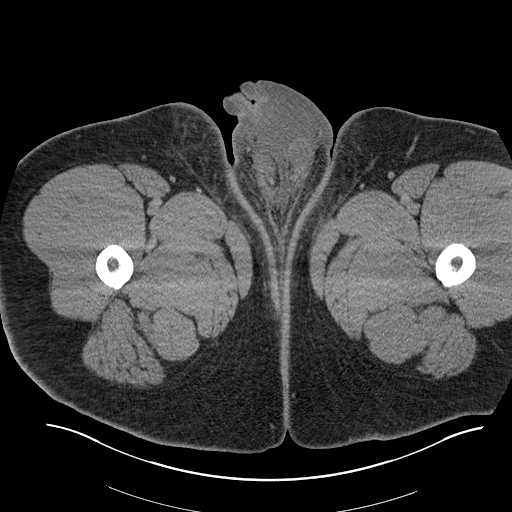
[im 25/130  soft-tissue]
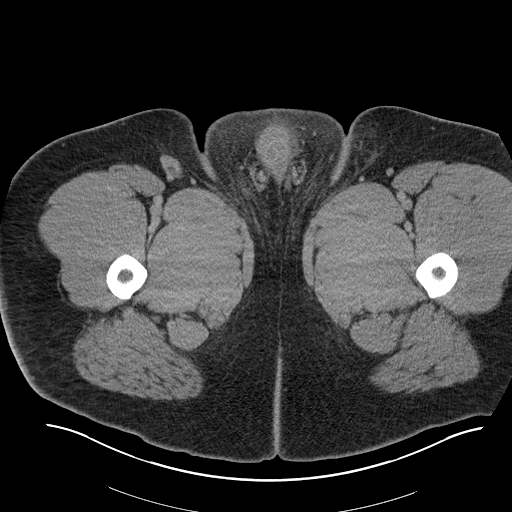
[im 41/130  soft-tissue]
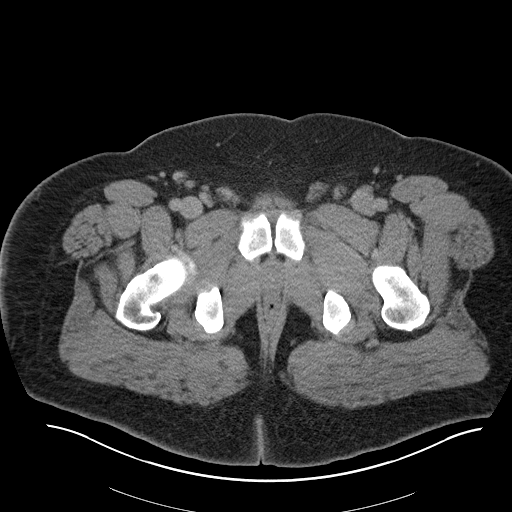
[im 49/130  soft-tissue]
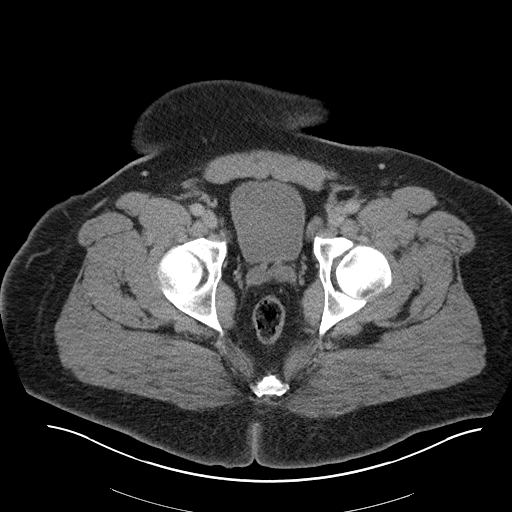
[im 57/130  soft-tissue]
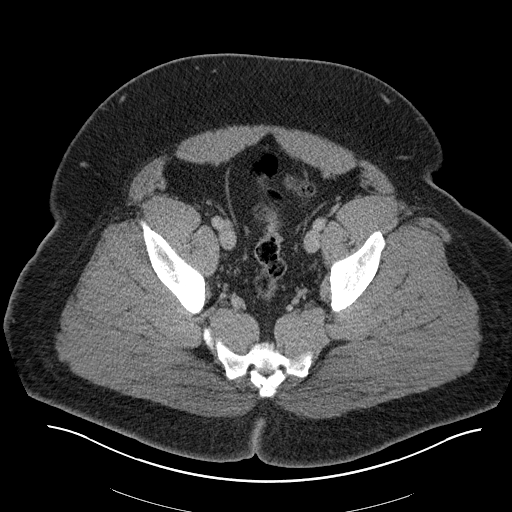
[im 65/130  soft-tissue]
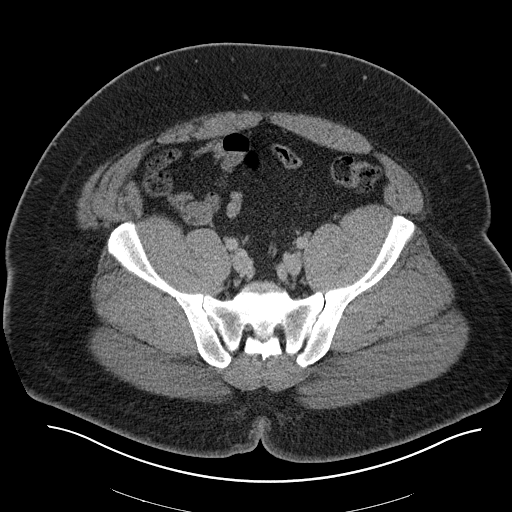
[im 73/130  soft-tissue]
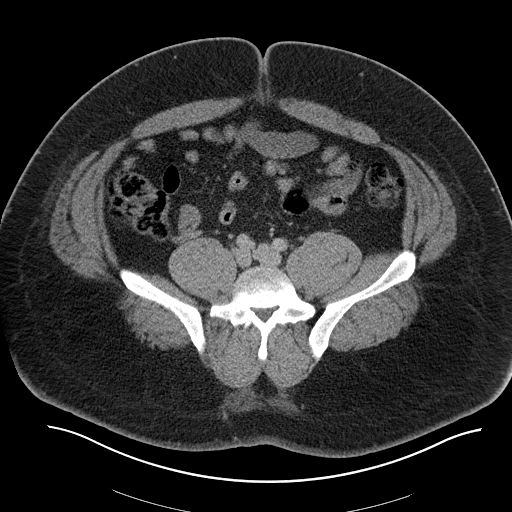
[im 81/130  soft-tissue]
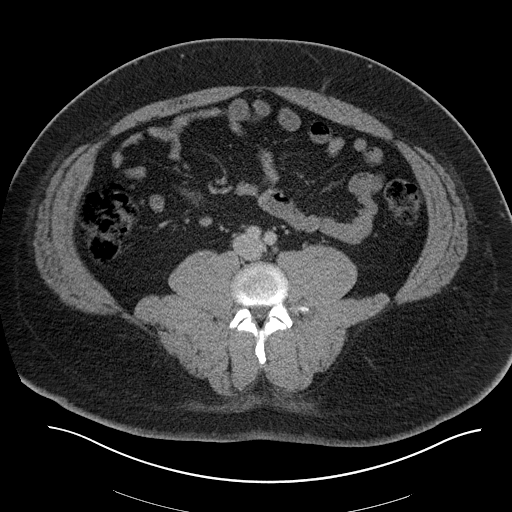
[im 81/130  bone]
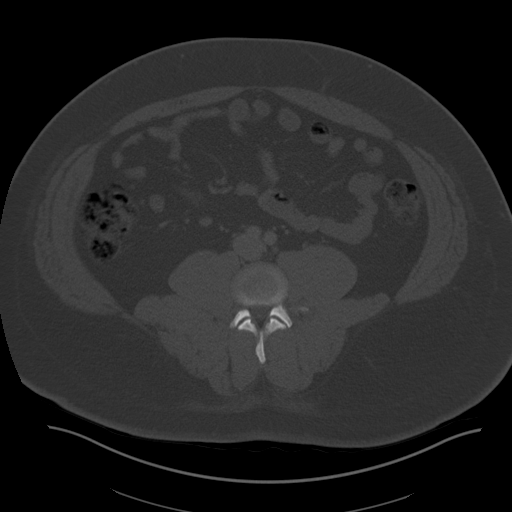
[im 89/130  soft-tissue]
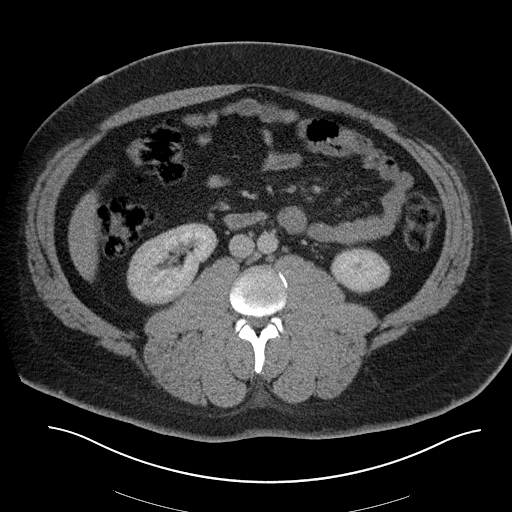
[im 105/130  soft-tissue]
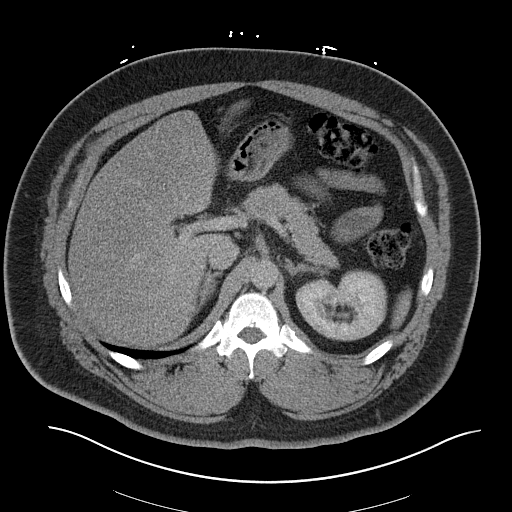
[im 113/130  soft-tissue]
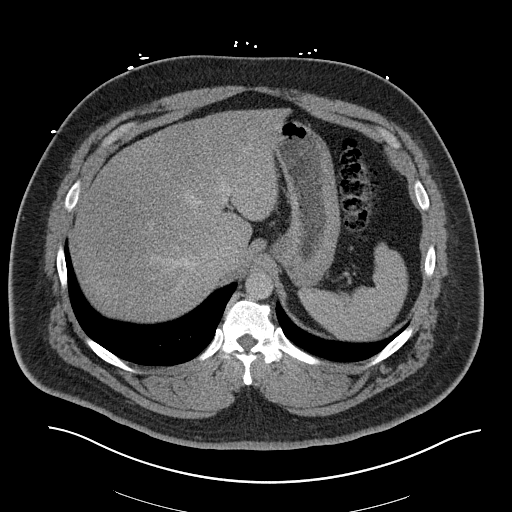
[im 121/130  soft-tissue]
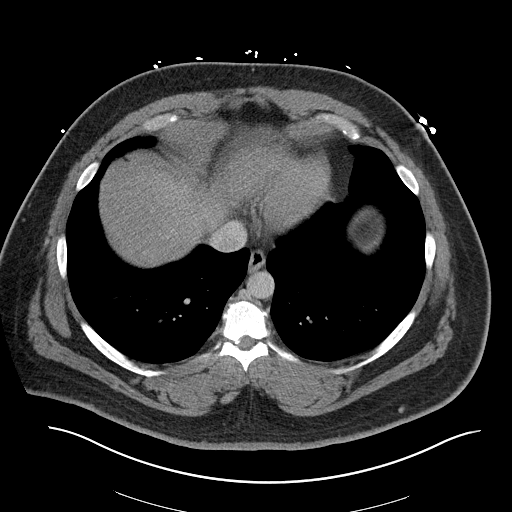

[Series 4: coronal st · coronal · 1.13mm/px · 3 of 118 slices shown]
[im 40/118  soft-tissue]
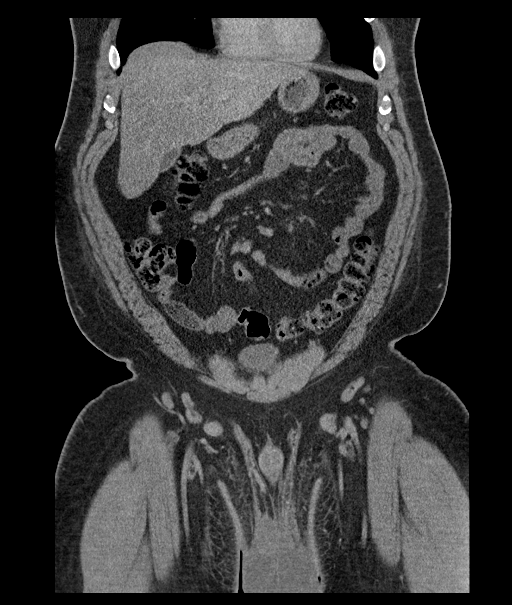
[im 53/118  soft-tissue]
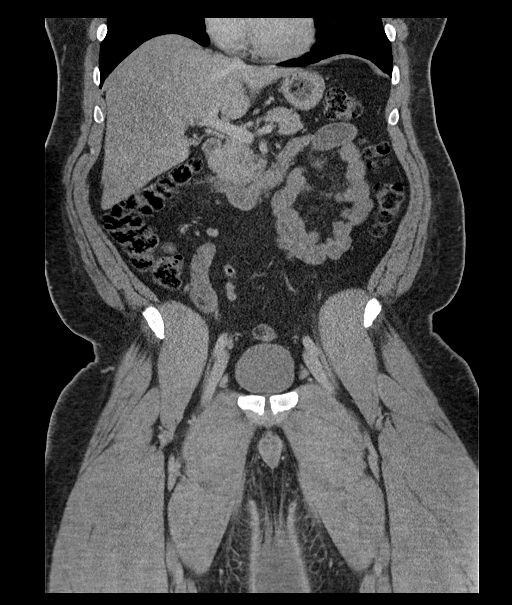
[im 66/118  soft-tissue]
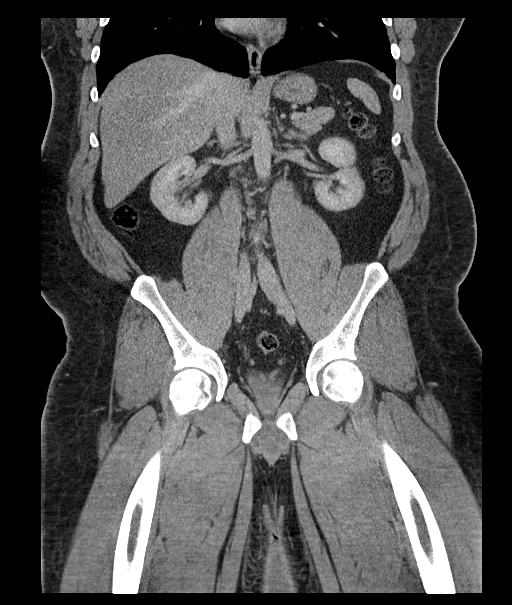

[16 of 46 positions shown; findings below may reference images not displayed]

FINDINGS: Lower chest: Assessed on concurrent chest CTA, reported separately.

Hepatobiliary: Prominent liver spanning 20.9 cm cranial caudal with
diffuse steatosis. No discrete focal hepatic lesion. Decompressed
gallbladder. No calcified gallstone. No biliary dilatation.

Pancreas: No ductal dilatation or inflammation.

Spleen: Normal in size without focal abnormality.

Adrenals/Urinary Tract: Normal adrenal glands. No hydronephrosis or
perinephric edema. Homogeneous renal enhancement. No visualized
renal calculi or focal lesion. Urinary bladder is physiologically
distended without wall thickening.

Stomach/Bowel: Stomach is within normal limits. Appendix appears
normal. No evidence of bowel wall thickening, distention, or
inflammatory changes.

Vascular/Lymphatic: Normal caliber abdominal aorta. No portal venous
or mesenteric gas. Multiple small retroperitoneal lymph nodes are
likely reactive. Mildly enlarged right greater than left inguinal
nodes, 12-16 mm short axis, also likely reactive.

Reproductive: There is diffuse and marked scrotal skin thickening.
No hydrocele is seen on scrotal ultrasound earlier today. There is
no evidence of focal fluid collection. No soft tissue air.
Unremarkable prostate.

Other: No free air or ascites. Tiny fat containing umbilical hernia.
There is no inguinal hernia.

Musculoskeletal: Hemi transitional lumbosacral anatomy with enlarged
left transverse process at the transitional lumbosacral vertebra.
There are no acute or suspicious osseous abnormalities.
IMPRESSION: 1. Diffuse and marked scrotal skin thickening, consistent with
cellulitis. No hydrocele was seen on scrotal ultrasound earlier
today. No abscess or soft tissue air. No extra scrotal inflammation
or extension of the perineum.
2. Hepatomegaly and hepatic steatosis.

## 2021-11-19 IMAGING — CT CT ANGIO CHEST
1 series · 1 of 1 positions shown · IV contrast (omnipaque)
Comparison: Radiograph earlier today

CLINICAL DATA: PE suspected, high prob

EXAM:
CT ANGIOGRAPHY CHEST WITH CONTRAST
TECHNIQUE: Multidetector CT imaging of the chest was performed using the
standard protocol during bolus administration of intravenous
contrast. Multiplanar CT image reconstructions and MIPs were
obtained to evaluate the vascular anatomy.
CONTRAST:  100mL OMNIPAQUE IOHEXOL 350 MG/ML SOLN

[Series 1: topogram 0.6 t20f · coronal · 2.00mm/px · 1 of 1 slices shown]
[im 1/1]
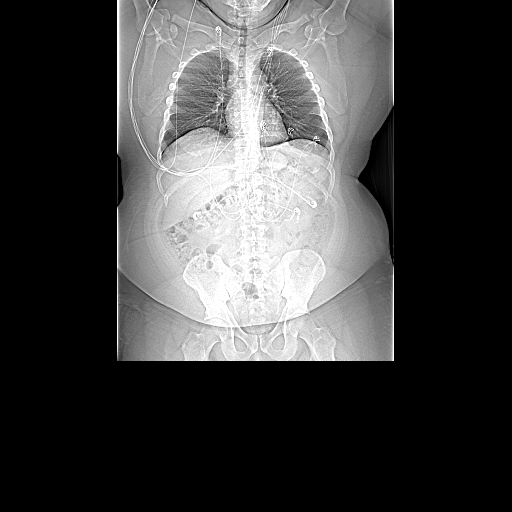

[1 of 1 positions shown; findings below may reference images not displayed]

FINDINGS: Cardiovascular: Technically suboptimal evaluation for pulmonary
embolus given contrast bolus and soft tissue attenuation from
habitus. Allowing for this, no pulmonary emboli are seen to the
lobar level. Normal caliber thoracic aorta. Heart is normal in size.
There is no pericardial effusion.

Mediastinum/Nodes: No enlarged mediastinal or hilar lymph nodes. No
esophageal wall thickening. No thyroid nodule.

Lungs/Pleura: Known for mild motion artifact limitation, the lungs
are clear. There is no focal airspace disease. No pleural fluid. No
findings of pulmonary edema. No pulmonary mass or nodule. Trachea
and central bronchi are patent.

Upper Abdomen: Assessed on concurrent abdominopelvic CT, reported
separately.

Musculoskeletal: There are no acute or suspicious osseous
abnormalities.

Review of the MIP images confirms the above findings.
IMPRESSION: 1. Technically suboptimal evaluation for pulmonary embolus given
contrast bolus and soft tissue attenuation from habitus. Allowing
for this, no pulmonary emboli are seen to the lobar level.
2. No acute intrathoracic abnormality.

## 2021-11-19 IMAGING — US US SCROTUM W/ DOPPLER COMPLETE
1 series · 13 of 25 positions shown · non-contrast
Comparison: None.

CLINICAL DATA: Edema. Patient states he put topical ointment/cream
for itching, now presenting with swelling.

EXAM:
SCROTAL ULTRASOUND
DOPPLER ULTRASOUND OF THE TESTICLES
TECHNIQUE: Complete ultrasound examination of the testicles, epididymis, and
other scrotal structures was performed. Color and spectral Doppler
ultrasound were also utilized to evaluate blood flow to the
testicles.

[Series 1: us scrotum w/ doppler complete · 13 of 48 slices shown]
[im 1/48]
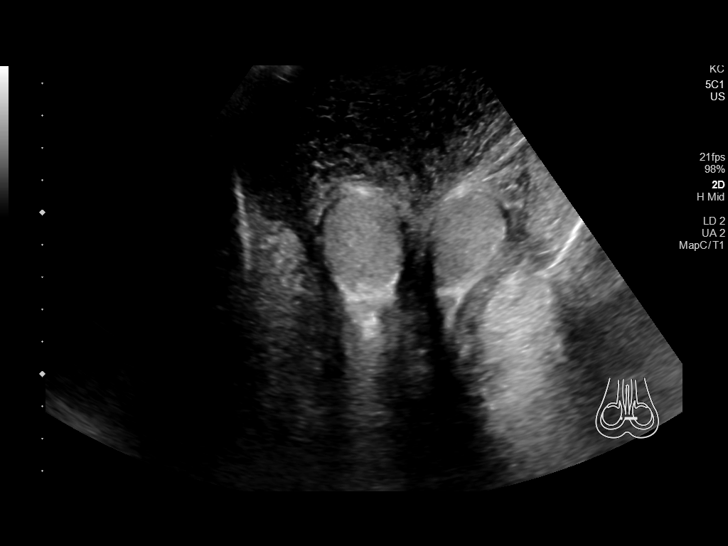
[im 4/48]
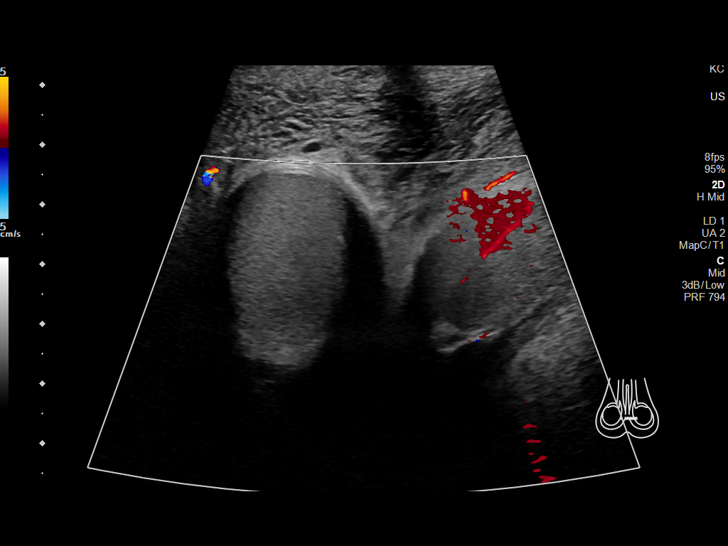
[im 8/48]
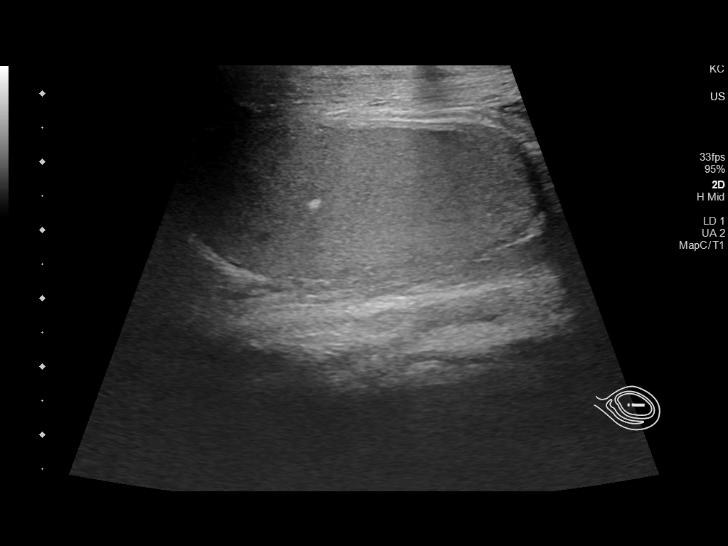
[im 12/48]
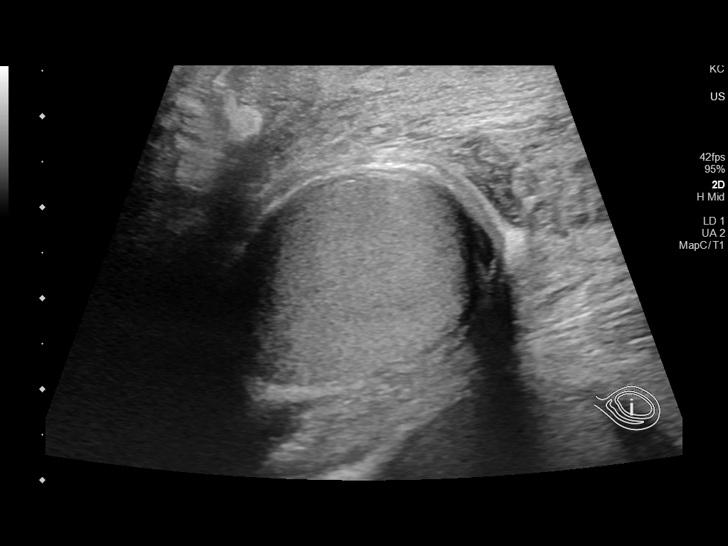
[im 16/48]
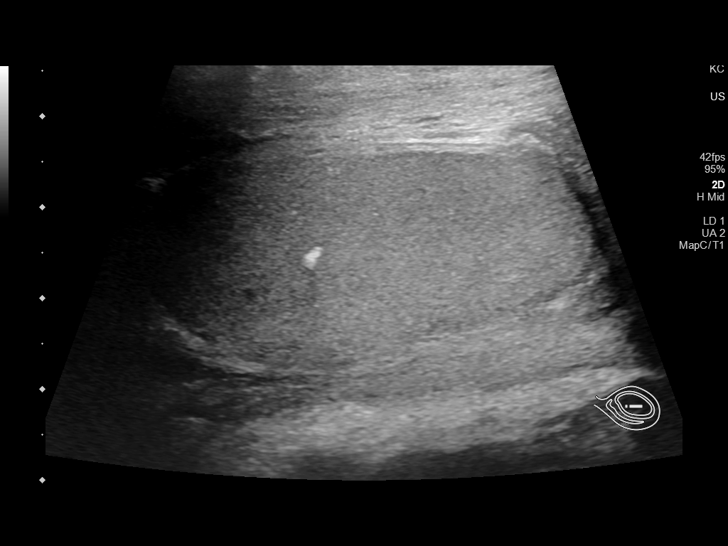
[im 20/48]
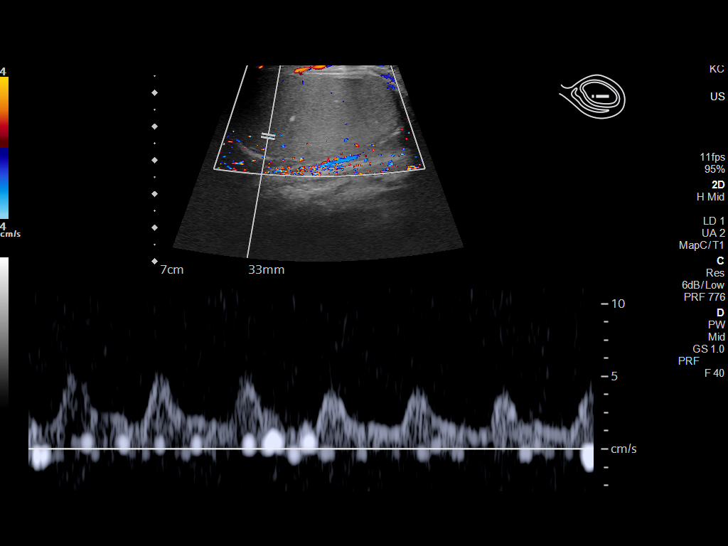
[im 24/48]
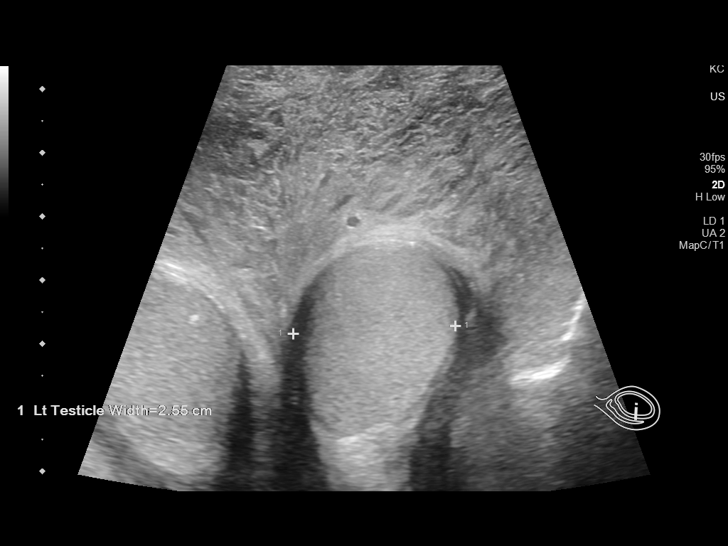
[im 28/48]
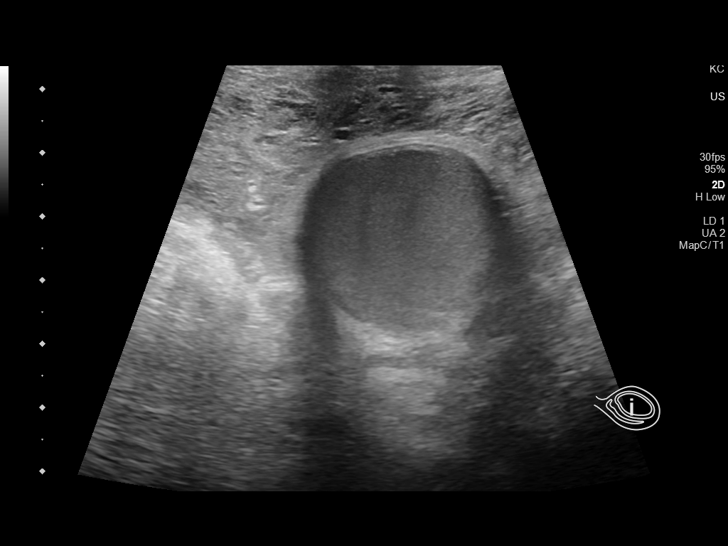
[im 32/48]
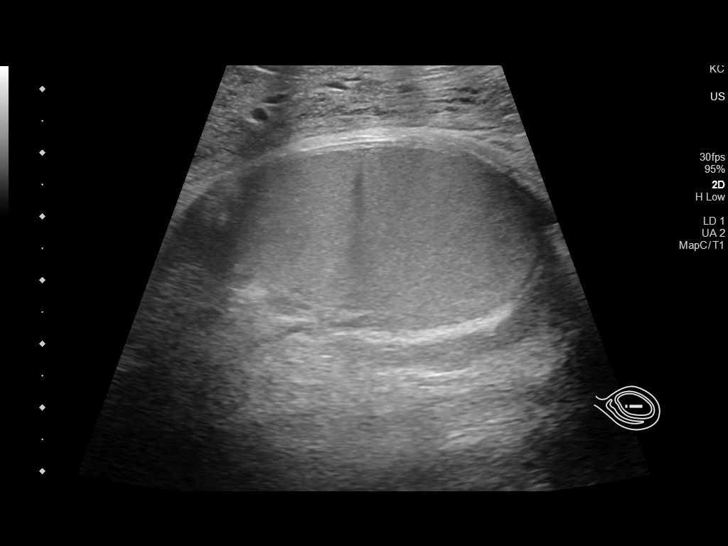
[im 36/48]
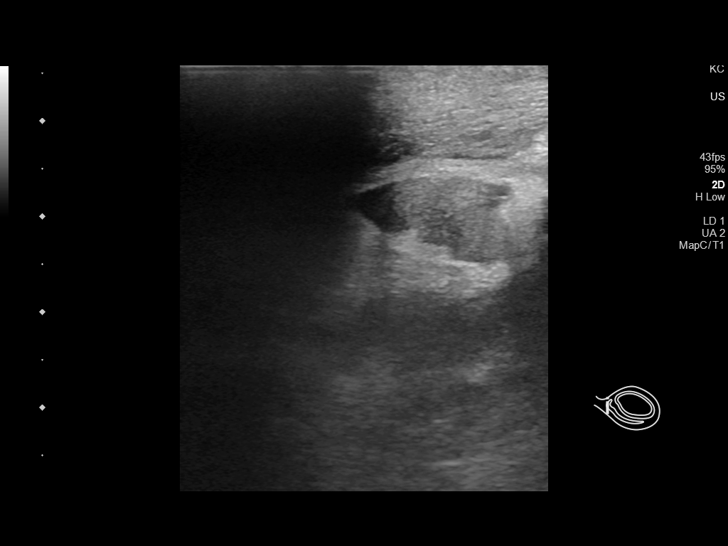
[im 40/48]
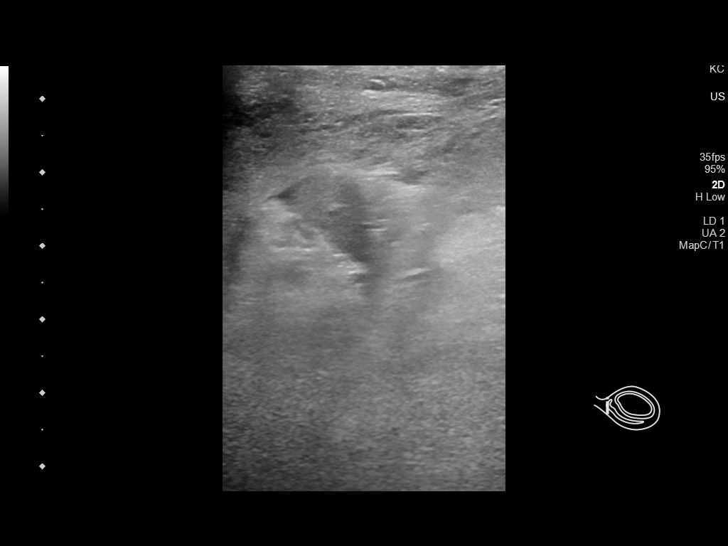
[im 44/48]
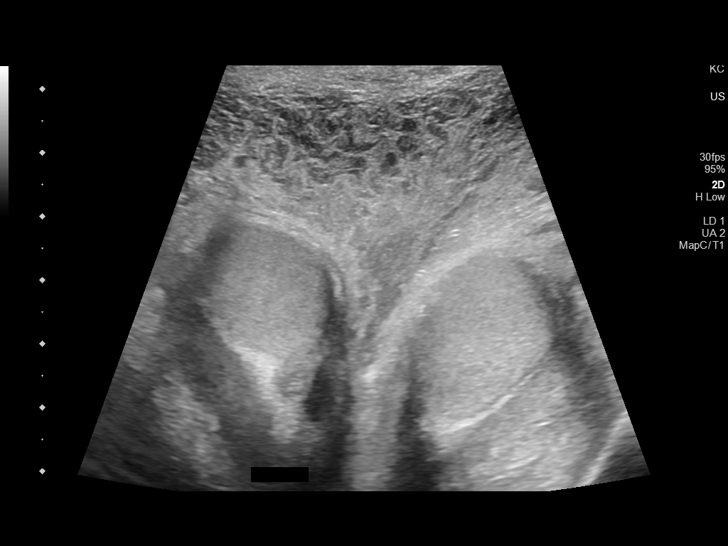
[im 48/48]
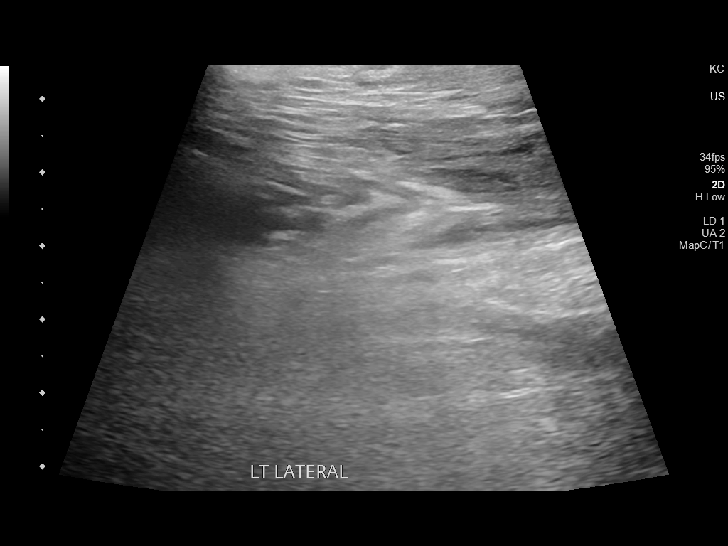

[13 of 25 positions shown; findings below may reference images not displayed]

FINDINGS: Right testicle

Measurements: 4.9 x 2.2 x 2.3 cm. No mass. Microlithiasis
visualized.

Left testicle

Measurements: 4.8 x 2.9 x 2.6 cm. No mass. Microlithiasis
visualized.

Right epididymis:  Normal in size and appearance.

Left epididymis:  Normal in size and appearance.

Hydrocele:  None visualized.

Varicocele:  None visualized.

Pulsed Doppler interrogation of both testes demonstrates normal low
resistance arterial and venous waveforms bilaterally.

Other: Subcutaneus soft tissue edema of bilateral scrotum. No
definite emphysema identified.
IMPRESSION: 1. Subcutaneus soft tissue edema of bilateral scrotum. No definite
emphysema identified. Please correlate clinically as a necrotizing
fasciitis cannot be excluded.
2. Current literature suggests that testicular microlithiasis is not
a significant independent risk factor for development of testicular
carcinoma, and that follow up imaging is not warranted in the
absence of other risk factors. Monthly testicular self-examination
and annual physical exams are considered appropriate surveillance.
If patient has other risk factors for testicular carcinoma, then
referral to Urology should be considered. (Reference: Eosf, et
al.: A 5-Year Follow up Study of Asymptomatic Men with Testicular
Microlithiasis. J Urol 3003; 179:0127-0125.)
3. Otherwise unremarkable ultrasound of the testicles.

## 2022-05-27 ENCOUNTER — Other Ambulatory Visit: Payer: Self-pay

## 2022-05-27 ENCOUNTER — Emergency Department (HOSPITAL_COMMUNITY)
Admission: EM | Admit: 2022-05-27 | Discharge: 2022-05-27 | Disposition: A | Discharge status: Home / Self Care | Payer: Commercial Managed Care - HMO | Attending: Emergency Medicine | Admitting: Emergency Medicine

## 2022-05-27 ENCOUNTER — Encounter (HOSPITAL_COMMUNITY): Payer: Self-pay

## 2022-05-27 DIAGNOSIS — K149 Disease of tongue, unspecified: Secondary | ICD-10-CM

## 2022-05-27 DIAGNOSIS — K146 Glossodynia: Secondary | ICD-10-CM | POA: Insufficient documentation

## 2022-05-27 DIAGNOSIS — R21 Rash and other nonspecific skin eruption: Secondary | ICD-10-CM | POA: Diagnosis present

## 2022-05-27 MED ORDER — LIDOCAINE VISCOUS HCL 2 % MT SOLN: 15.0000 mL | OROMUCOSAL | 0 refills | Status: AC | PRN

## 2022-05-27 NOTE — ED Provider Notes (Signed)
[facility]-EMERGENCY DEPT Provider Note   CSN: [id number] Arrival date & time: 05/27/22  0919     History  Chief Complaint  Patient presents with   Rash    Gary Chavez is a 31 y.o. male with no significant past medical history who presents with concern for some irritation to his tongue since Thursday or Friday.  Patient reports that he had a telehealth visit and was given some Magic mouthwash.  Patient denies HIV, asthma he does not use albuterol, he denies any known injury to the tongue other than he reports that he does eat some spicy food on occasion.  Patient reports that on evaluation of his tongue he thinks that the taste buds especially towards the back of his tongue are swollen.  Patient describes the tongue irritation as burning and thinks there may be a rash present on the tongue.  He reports that it does seem to be somewhat improved after beginning Magic mouthwash.   Rash      Home Medications Prior to Admission medications   Medication Sig Start Date End Date Taking? Authorizing Provider  lidocaine (XYLOCAINE) 2 % solution Use as directed 15 mLs in the mouth or throat as needed for mouth pain. 05/27/22  Yes Miley Lindon H, PA-C  ketoconazole (NIZORAL) 2 % cream Apply 1 application topically daily. 06/05/21   Arthor Captain, PA-C      Allergies    Penicillins    Review of Systems   Review of Systems  Skin:  Positive for rash.  All other systems reviewed and are negative.   Physical Exam Updated Vital Signs BP (!) 153/108   Pulse 97   Temp 98.3 F (36.8 C) (Oral)   Resp 17   Ht 6\' 2"  (1.88 m)   Wt (!) 163.3 kg   SpO2 96%   BMI 46.22 kg/m  Physical Exam Vitals and nursing note reviewed.  Constitutional:      General: He is not in acute distress.    Appearance: Normal appearance.  HENT:     Head: Normocephalic and atraumatic.     Comments: Overall normal-appearing tongue, patient does have prominent posterior taste buds,  however he also has a large and easily protruded tongue, I do not see any irritation, redness, evidence of white rash, leukoplakia, bleeding, ulcers.  No evidence of ulcers or other abnormality in the oropharynx. Eyes:     General:        Right eye: No discharge.        Left eye: No discharge.  Cardiovascular:     Rate and Rhythm: Normal rate and regular rhythm.  Pulmonary:     Effort: Pulmonary effort is normal.     Breath sounds: Normal breath sounds.  Skin:    General: Skin is warm and dry.     Capillary Refill: Capillary refill takes less than 2 seconds.  Neurological:     Mental Status: He is alert and oriented to person, place, and time.  Psychiatric:        Mood and Affect: Mood normal.        Behavior: Behavior normal.     ED Results / Procedures / Treatments   Labs (all labs ordered are listed, but only abnormal results are displayed) Labs Reviewed - No data to display  EKG None  Radiology No results found.  Procedures Procedures    Medications Ordered in ED Medications - No data to display  ED Course/ Medical Decision Making/ A&P  Medical Decision Making  Patient with 3 days of tongue irritation, reports some improvement with Magic mouthwash.  No emergent differential diagnosis includes tongue irritation of unknown origin secondary to spicy food or other irritation from eating.  I have low clinical concern for leukoplakia, infection, cancer, allergic dermatitis, aphthous ulcers, or other abnormalities based on patient's physical exam.  I encouraged him to continue using Magic mouthwash, will provide viscous lidocaine to help with irritation.  Encourage follow-up with dentistry, PCP, urgent care.  Return precautions given.  Patient discharged in stable condition at this time. Final Clinical Impression(s) / ED Diagnoses Final diagnoses:  Tongue irritation    Rx / DC Orders ED Discharge Orders          Ordered    lidocaine  (XYLOCAINE) 2 % solution  As needed        05/27/22 1056              Catalino Plascencia, [place] H, PA-C 05/27/22 1103    Gloris Manchester, MD 05/28/22 [phone]

## 2022-05-27 NOTE — ED Triage Notes (Addendum)
Pt reports rash to his tongue since Friday. Denies any other symptoms or complaints.

## 2022-05-27 NOTE — Discharge Instructions (Signed)
I would recommend continuing the Magic mouthwash, you can use the viscous lidocaine that I provided in addition to help with any irritation.  I would refrain from any spicy or irritating foods for a few days until your symptoms are improving.  You can follow-up with a dentist or your primary care or urgent care if your symptoms persist despite treatment.  If you want to try something anti-inflammatory you can take some over-the-counter ibuprofen 600-800 mg every 6 hours as needed, maximum dose of 3200 mg in 24 hours.

## 2022-05-27 NOTE — ED Provider Triage Note (Signed)
Emergency Medicine Provider Triage Evaluation Note  Gary Chavez , a 31 y.o. male  was evaluated in triage.  Pt complains of rash to tongue.  Patient reports on Thursday he developed a rash in the middle of his tongue described as burning with small dots.  Patient states that on Saturday he was seen by his telehealth appointment and provided with Magic mouthwash, patient states he took it Saturday night however it did not alleviate his rash.  The patient states that he feels as if the Magic oral mouthwash worsened his rash.  The patient denies any fevers, nausea, vomiting.  Patient denies using albuterol inhalers, steroids.  Patient denies history of AIDS.  Review of Systems  Positive:  Negative:   Physical Exam  BP (!) 183/121 (BP Location: Right Arm)   Pulse (!) 108   Temp 98.3 F (36.8 C) (Oral)   Resp 18   Ht 6\' 2"  (1.88 m)   Wt (!) 163.3 kg   SpO2 95%   BMI 46.22 kg/m  Gen:   Awake, no distress   Resp:  Normal effort  MSK:   Moves extremities without difficulty  Other:    Medical Decision Making  Medically screening exam initiated at 9:59 AM.  Appropriate orders placed.  Eathan Groman was informed that the remainder of the evaluation will be completed by another provider, this initial triage assessment does not replace that evaluation, and the importance of remaining in the ED until their evaluation is complete.     Lennox Grumbles, PA-C 05/27/22 1000

## 2024-01-18 ENCOUNTER — Emergency Department (HOSPITAL_COMMUNITY)
Admission: EM | Admit: 2024-01-18 | Discharge: 2024-01-18 | Disposition: A | Discharge status: Home / Self Care | Payer: Self-pay | Attending: Emergency Medicine | Admitting: Emergency Medicine

## 2024-01-18 ENCOUNTER — Emergency Department (HOSPITAL_COMMUNITY): Payer: Self-pay

## 2024-01-18 DIAGNOSIS — F410 Panic disorder [episodic paroxysmal anxiety] without agoraphobia: Secondary | ICD-10-CM | POA: Insufficient documentation

## 2024-01-18 LAB — COMPREHENSIVE METABOLIC PANEL WITH GFR
ALT: 35 U/L (ref 0–44)
AST: 27 U/L (ref 15–41)
Albumin: 4 g/dL (ref 3.5–5.0)
Alkaline Phosphatase: 66 U/L (ref 38–126)
Anion gap: 9 (ref 5–15)
BUN: 12 mg/dL (ref 6–20)
CO2: 24 mmol/L (ref 22–32)
Calcium: 9.4 mg/dL (ref 8.9–10.3)
Chloride: 104 mmol/L (ref 98–111)
Creatinine, Ser: 0.99 mg/dL (ref 0.61–1.24)
GFR, Estimated: >60 mL/min (ref >60)
Glucose, Bld: 100 mg/dL — ABNORMAL HIGH (ref 70–99)
Potassium: 3.7 mmol/L (ref 3.5–5.1)
Sodium: 137 mmol/L (ref 135–145)
Total Bilirubin: 0.6 mg/dL (ref 0.0–1.2)
Total Protein: 7.5 g/dL (ref 6.5–8.1)

## 2024-01-18 LAB — CBC WITH DIFFERENTIAL/PLATELET
Abs Immature Granulocytes: 0.03 10*3/uL (ref 0.00–0.07)
Basophils Absolute: 0 10*3/uL (ref 0.0–0.1)
Basophils Relative: 0 %
Eosinophils Absolute: 0.4 10*3/uL (ref 0.0–0.5)
Eosinophils Relative: 4 %
HCT: 45.5 % (ref 39.0–52.0)
Hemoglobin: 14.9 g/dL (ref 13.0–17.0)
Immature Granulocytes: 0 %
Lymphocytes Relative: 17 %
Lymphs Abs: 1.5 10*3/uL (ref 0.7–4.0)
MCH: 28.9 pg (ref 26.0–34.0)
MCHC: 32.7 g/dL (ref 30.0–36.0)
MCV: 88.3 fL (ref 80.0–100.0)
Monocytes Absolute: 0.5 10*3/uL (ref 0.1–1.0)
Monocytes Relative: 6 %
Neutro Abs: 6.6 10*3/uL (ref 1.7–7.7)
Neutrophils Relative %: 73 %
Platelets: 246 10*3/uL (ref 150–400)
RBC: 5.15 MIL/uL (ref 4.22–5.81)
RDW: 13.2 % (ref 11.5–15.5)
WBC: 9.1 10*3/uL (ref 4.0–10.5)
nRBC: 0 % (ref 0.0–0.2)

## 2024-01-18 LAB — D-DIMER, QUANTITATIVE: D-Dimer, Quant: 1.43 ug{FEU}/mL — ABNORMAL HIGH (ref 0.00–0.50)

## 2024-01-18 LAB — RAPID URINE DRUG SCREEN, HOSP PERFORMED
Amphetamines: NOT DETECTED
Barbiturates: NOT DETECTED
Benzodiazepines: NOT DETECTED
Cocaine: NOT DETECTED
Opiates: NOT DETECTED
Tetrahydrocannabinol: NOT DETECTED

## 2024-01-18 LAB — TSH: TSH: 0.88 u[IU]/mL (ref 0.350–4.500)

## 2024-01-18 LAB — RESP PANEL BY RT-PCR (RSV, FLU A&B, COVID)  RVPGX2
Influenza A by PCR: NEGATIVE
Influenza B by PCR: NEGATIVE
Resp Syncytial Virus by PCR: NEGATIVE
SARS Coronavirus 2 by RT PCR: NEGATIVE

## 2024-01-18 LAB — TROPONIN I (HIGH SENSITIVITY): Troponin I (High Sensitivity): 3 ng/L (ref <18)

## 2024-01-18 LAB — CK: Total CK: 411 U/L — ABNORMAL HIGH (ref 49–397)

## 2024-01-18 MED ORDER — HYDROXYZINE HCL 25 MG PO TABS: 25.0000 mg | ORAL_TABLET | Freq: Four times a day (QID) | ORAL | 0 refills | Status: AC | PRN

## 2024-01-18 MED ORDER — SODIUM CHLORIDE 0.9 % IV BOLUS
1000.0000 mL | Freq: Once | INTRAVENOUS | Status: AC
  Administered 2024-01-18: 1000 mL via INTRAVENOUS

## 2024-01-18 MED ORDER — IOHEXOL 350 MG/ML SOLN
75.0000 mL | Freq: Once | INTRAVENOUS | Status: AC | PRN
  Administered 2024-01-18: 75 mL via INTRAVENOUS

## 2024-01-18 MED ORDER — DIAZEPAM 5 MG/ML IJ SOLN
2.5000 mg | Freq: Once | INTRAMUSCULAR | Status: AC
  Administered 2024-01-18: 2.5 mg via INTRAVENOUS
  Filled 2024-01-18: qty 2

## 2024-01-18 NOTE — ED Triage Notes (Signed)
 Patient reports he ate bread last night, noticed the bread had been eaten by rodent In home This morning left side of face, head, arm tingling and cramping Stroke screen negative in triage Patient says he doesn't believe it is stroke BP elevated; denies history of HTN

## 2024-01-18 NOTE — ED Provider Notes (Signed)
 [place] EMERGENCY DEPARTMENT AT [facility] Provider Note   CSN: [id number] Arrival date & time: 01/18/24  4098     History {Add pertinent medical, surgical, social history, OB history to HPI:1} Chief Complaint  Patient presents with   Tingling    Gary Chavez is a 33 y.o. male who presents emergency department with chief complaint of bodyaches.  Patient reports that today he had onset of pain in his face arms hands and legs.  Also felt numbness and tingling globally globally.  He reports that he had eaten a piece of bread yesterday and thinks that it might have been eaten by rats.  This made him think that he could have hantavirus patient reports that he took ibuprofen and then he also took Benadryl because he thought it could also be an allergic reaction he has allergies to some foods and environment and thinks he might also be allergic to rodents.  Does admit that he felt very anxious.  He denies chest pain.  He states he does not have a history of hypertension and had it checked as recently as 3 weeks ago.  He thinks that this is extremely unusual.  He denies a history of drug use.  She reports no rodents visibly seen in his home.  HPI     Home Medications Prior to Admission medications   Medication Sig Start Date End Date Taking? Authorizing Provider  ketoconazole (NIZORAL) 2 % cream Apply 1 application topically daily. 06/05/21   Mj Willis, Cammy Copa, PA-C  lidocaine (XYLOCAINE) 2 % solution Use as directed 15 mLs in the mouth or throat as needed for mouth pain. 05/27/22   Prosperi, Christian H, PA-C      Allergies    Penicillins    Review of Systems   Review of Systems  Physical Exam Updated Vital Signs BP (!) 193/101   Pulse (!) 106   Temp 98.2 F (36.8 C) (Oral)   Resp 18   Ht 6\' 2"  (1.88 m)   Wt (!) 181.4 kg   SpO2 100%   BMI 51.36 kg/m  Physical Exam Vitals and nursing note reviewed.  Constitutional:      General: He is not in acute distress.     Appearance: He is well-developed. He is not diaphoretic.  HENT:     Head: Normocephalic and atraumatic.  Eyes:     General: No scleral icterus.    Conjunctiva/sclera: Conjunctivae normal.  Cardiovascular:     Rate and Rhythm: Regular rhythm. Tachycardia present.     Heart sounds: Normal heart sounds.  Pulmonary:     Effort: Pulmonary effort is normal. No respiratory distress.     Breath sounds: Normal breath sounds.  Abdominal:     Palpations: Abdomen is soft.     Tenderness: There is no abdominal tenderness.  Musculoskeletal:     Cervical back: Normal range of motion and neck supple.  Skin:    General: Skin is warm and dry.  Neurological:     Mental Status: He is alert and oriented to person, place, and time.     Cranial Nerves: No facial asymmetry.  Psychiatric:        Attention and Perception: Attention normal.        Mood and Affect: Mood is anxious.        Speech: Speech normal.        Behavior: Behavior normal. Behavior is cooperative.     ED Results / Procedures / Treatments   Labs (all labs  ordered are listed, but only abnormal results are displayed) Labs Reviewed  RESP PANEL BY RT-PCR (RSV, FLU A&B, COVID)  [id number]  COMPREHENSIVE METABOLIC PANEL WITH GFR  CBC WITH DIFFERENTIAL/PLATELET  RAPID URINE DRUG SCREEN, HOSP PERFORMED  TSH  TROPONIN I (HIGH SENSITIVITY)    EKG None  Radiology No results found.  Procedures Procedures  .  He reports  Medications Ordered in ED Medications - No data to display  ED Course/ Medical Decision Making/ A&P   {   Click here for ABCD2, HEART and other calculatorsREFRESH Note before signing :1}                              Medical Decision Making Amount and/or Complexity of Data Reviewed Labs: ordered.   Patient and I discussed sxs.\ Discussed the likelihood of Hantavirus is extremely low AND there would be an incubation period which makes the likelihood of hantavirus extremely unlikely. Differential diagnosis  includes ingestion, anxiety,  {Document critical care time when appropriate:1} {Document review of labs and clinical decision tools ie heart score, Chads2Vasc2 etc:1}  {Document your independent review of radiology images, and any outside records:1} {Document your discussion with family members, caretakers, and with consultants:1} {Document social determinants of health affecting pt's care:1} {Document your decision making why or why not admission, treatments were needed:1} Final Clinical Impression(s) / ED Diagnoses Final diagnoses:  None    Rx / DC Orders ED Discharge Orders     None

## 2024-01-18 NOTE — Discharge Instructions (Addendum)
 Contact a health care provider if: Your symptoms do not improve, or they get worse. You are not able to take your medicine as prescribed because of side effects. Get help right away if: You have thoughts about hurting yourself or others. Get help right away if you feel like you may hurt yourself or others, or have thoughts about taking your own life. Go to your nearest emergency room or: Call 911. Call the National Suicide Prevention Lifeline at 936 157 2957 or 988. This is open 24 hours a day. Text the Crisis Text Line at 515-084-6726. Summary A panic attack is a sudden episode of severe anxiety, fear, or discomfort that causes physical and emotional symptoms. Always see a health care provider to have the reasons for the panic attack correctly diagnosed. If your panic attack was caused by a physical problem, follow your health care provider's suggestions for medicine, referral to a specialist, and lifestyle changes. If your panic attack was caused by an emotional problem, follow through with counseling from a qualified mental health specialist. If you feel like you may hurt yourself or others, call 911 and get help right away.
